# Patient Record
Sex: Female | Born: 1935
Health system: Southern US, Community
[De-identification: ages and names within clinical notes are randomized; demographics above are authoritative.]

## PROBLEM LIST (undated history)

## (undated) DIAGNOSIS — M1990 Unspecified osteoarthritis, unspecified site: Secondary | ICD-10-CM

## (undated) DIAGNOSIS — K219 Gastro-esophageal reflux disease without esophagitis: Secondary | ICD-10-CM

## (undated) DIAGNOSIS — M109 Gout, unspecified: Secondary | ICD-10-CM

## (undated) DIAGNOSIS — R7301 Impaired fasting glucose: Secondary | ICD-10-CM

## (undated) DIAGNOSIS — I10 Essential (primary) hypertension: Secondary | ICD-10-CM

## (undated) DIAGNOSIS — F419 Anxiety disorder, unspecified: Secondary | ICD-10-CM

## (undated) DIAGNOSIS — E119 Type 2 diabetes mellitus without complications: Secondary | ICD-10-CM

## (undated) DIAGNOSIS — E785 Hyperlipidemia, unspecified: Secondary | ICD-10-CM

## (undated) DIAGNOSIS — D649 Anemia, unspecified: Secondary | ICD-10-CM

## (undated) HISTORY — DX: Anxiety disorder, unspecified: F41.9

## (undated) HISTORY — DX: Gastro-esophageal reflux disease without esophagitis: K21.9

## (undated) HISTORY — DX: Hyperlipidemia, unspecified: E78.5

## (undated) HISTORY — DX: Impaired fasting glucose: R73.01

## (undated) HISTORY — DX: Type 2 diabetes mellitus without complications: E11.9

## (undated) HISTORY — PX: ABDOMINAL HYSTERECTOMY: SHX81

## (undated) HISTORY — DX: Gout, unspecified: M10.9

## (undated) HISTORY — DX: Essential (primary) hypertension: I10

## (undated) HISTORY — DX: Anemia, unspecified: D64.9

---

## 2006-07-17 ENCOUNTER — Ambulatory Visit: Payer: Self-pay | Admitting: Cardiology

## 2006-07-24 ENCOUNTER — Ambulatory Visit: Payer: Self-pay | Admitting: Cardiology

## 2006-07-24 ENCOUNTER — Ambulatory Visit: Payer: Self-pay

## 2006-09-20 ENCOUNTER — Ambulatory Visit: Payer: Self-pay | Admitting: Cardiology

## 2006-09-27 ENCOUNTER — Ambulatory Visit: Payer: Self-pay | Admitting: Cardiology

## 2006-11-08 ENCOUNTER — Ambulatory Visit: Payer: Self-pay | Admitting: Cardiology

## 2006-11-30 ENCOUNTER — Ambulatory Visit: Payer: Self-pay

## 2006-12-12 ENCOUNTER — Encounter: Admission: RE | Admit: 2006-12-12 | Discharge: 2006-12-12 | Payer: Self-pay | Admitting: Cardiology

## 2006-12-14 ENCOUNTER — Ambulatory Visit: Payer: Self-pay | Admitting: Cardiology

## 2006-12-15 ENCOUNTER — Encounter: Admission: RE | Admit: 2006-12-15 | Discharge: 2006-12-15 | Payer: Self-pay | Admitting: Cardiology

## 2007-01-26 ENCOUNTER — Ambulatory Visit: Payer: Self-pay | Admitting: Cardiology

## 2007-02-16 ENCOUNTER — Ambulatory Visit: Payer: Self-pay | Admitting: Cardiovascular Disease

## 2007-02-16 LAB — CONVERTED CEMR LAB
CO2: 30 meq/L (ref 19–32)
Calcium: 9.6 mg/dL (ref 8.4–10.5)
Eosinophils Absolute: 0.1 10*3/uL (ref 0.0–0.6)
Eosinophils Relative: 1.7 % (ref 0.0–5.0)
GFR calc Af Amer: 79 mL/min
Glucose, Bld: 105 mg/dL — ABNORMAL HIGH (ref 70–99)
Hemoglobin: 13.8 g/dL (ref 12.0–15.0)
Lymphocytes Relative: 27.4 % (ref 12.0–46.0)
MCV: 81.9 fL (ref 78.0–100.0)
Monocytes Absolute: 0.8 10*3/uL — ABNORMAL HIGH (ref 0.2–0.7)
Neutro Abs: 4.2 10*3/uL (ref 1.4–7.7)
Neutrophils Relative %: 59.3 % (ref 43.0–77.0)
Platelets: 237 10*3/uL (ref 150–400)
Potassium: 4.2 meq/L (ref 3.5–5.1)
Sodium: 139 meq/L (ref 135–145)
WBC: 7 10*3/uL (ref 4.5–10.5)

## 2007-02-21 ENCOUNTER — Ambulatory Visit (HOSPITAL_COMMUNITY): Admission: RE | Admit: 2007-02-21 | Discharge: 2007-02-21 | Payer: Self-pay | Admitting: Cardiovascular Disease

## 2007-04-27 ENCOUNTER — Ambulatory Visit: Payer: Self-pay | Admitting: Cardiology

## 2007-08-23 ENCOUNTER — Ambulatory Visit: Payer: Self-pay | Admitting: Cardiology

## 2008-01-31 ENCOUNTER — Ambulatory Visit: Payer: Self-pay | Admitting: Cardiology

## 2008-01-31 LAB — CONVERTED CEMR LAB
CO2: 27 meq/L (ref 19–32)
Chloride: 105 meq/L (ref 96–112)
Creatinine, Ser: 0.9 mg/dL (ref 0.4–1.2)
Potassium: 4 meq/L (ref 3.5–5.1)
Sodium: 138 meq/L (ref 135–145)

## 2009-02-04 ENCOUNTER — Ambulatory Visit: Payer: Self-pay | Admitting: Cardiology

## 2009-02-04 LAB — CONVERTED CEMR LAB
CO2: 31 meq/L (ref 19–32)
Chloride: 105 meq/L (ref 96–112)
GFR calc Af Amer: 63 mL/min
Glucose, Bld: 143 mg/dL — ABNORMAL HIGH (ref 70–99)
Sodium: 139 meq/L (ref 135–145)

## 2009-05-19 ENCOUNTER — Encounter: Admission: RE | Admit: 2009-05-19 | Discharge: 2009-05-19 | Payer: Self-pay | Admitting: Internal Medicine

## 2010-06-03 ENCOUNTER — Encounter: Admission: RE | Admit: 2010-06-03 | Discharge: 2010-06-03 | Payer: Self-pay | Admitting: Internal Medicine

## 2011-05-10 NOTE — Assessment & Plan Note (Signed)
[facility]                            CARDIOLOGY OFFICE NOTE   NAME:Schiavi, KEAH LAMBA                       MRN:          [id number]  DATE:01/31/2008                            DOB:          1933/02/02    HISTORY:  Ms. Schraeder is a pleasant female who has a history of  hypertension.  Her previous renal arteriogram showed no obstructive  renal artery stenosis.  Since I last saw her she has visited [place] due  to a child that has been diagnosed with multiple sclerosis.  However,  she is feeling well otherwise.  There is no dyspnea, chest pain,  palpitations or syncope.  There is no pedal edema.   MEDICATIONS:  1. Her medications include atenolol 50 mg p.o. daily.  2. Aspirin 81 mg p.o. daily.  3. Norvasc 10 mg p.o. daily.  4. Fish oil.  5. Benicar HCT 40/12.5 daily.   PHYSICAL EXAMINATION:  VITAL SIGNS:  Physical examination today shows a  blood pressure of 183/82, but she has not taken her Benicar HCT.  Her  pulse is 68.  She weighs 189 pounds.  HEENT:  Normal.  NECK:  Supple with no bruits.  CHEST:  Clear.  CARDIOVASCULAR:  Regular rate.  ABDOMEN:  Exam shows no tenderness.  EXTREMITIES:  Show no edema.   Her electrocardiogram shows a sinus rhythm at a rate of 66.  There are  nonspecific ST changes.   DIAGNOSES:  1. Hypertension - Ms. Antuna's blood pressure is elevated today.      However, she has not taken her Benicar HCT.  She has brought      records from home and her systolic is running in the 130-140 range.      We will therefore make no changes.  Note also that her diastolic is      less than 80.  We will check a BMET today to follow potassium and      renal function.  We will see her back in 1 year for this issue.  I      have asked her to find a primary care physician for other medical      issues.  2. Cough with ACE inhibition.     Madolyn Frieze Jens Som, MD, [facility]  Electronically Signed    BSC/MedQ  DD: 01/31/2008  DT:  02/01/2008  Job #: [id number]

## 2011-05-10 NOTE — Assessment & Plan Note (Signed)
[facility]                            CARDIOLOGY OFFICE NOTE   NAME:STINSONCyndee, Giammarco                       MRN:          [id number]  DATE:02/04/2009                            DOB:          October 04, 1933    Ms. Eads is a pleasant female who has a history of hypertension.  A  previous renal arteriogram showed no obstructive renal artery stenosis.  Since I last saw her on January 31, 2008, she is doing well  symptomatically.  She denies any chest pain, palpitations, or shortness  of breath.  She occasionally feels minimally lightheaded when she stands  suddenly.   Her medications at present include:  1. Benicar HCT 40/12.5 mg p.o. daily.  2. Atenolol 50 mg p.o. daily.  3. Aspirin 81 mg p.o. daily.  4. Norvasc 10 mg p.o. daily.  5. Fish oil.   PHYSICAL EXAMINATION:  VITAL SIGNS:  Blood pressure 161/70 and pulse is  68.  She weighs 184 pounds.  HEENT:  Normal.  NECK:  Supple.  CHEST:  Clear.  VASCULAR:  Regular rate and rhythm.  ABDOMEN:  No tenderness.  EXTREMITIES:  No edema.   Electrocardiogram shows a sinus rhythm at a rate of 66.  There is left  ventricle hypertrophy and left axis deviation.  There is poor R-wave  progression.  There are nonspecific ST changes.   DIAGNOSIS:  1. Hypertension - Ms. Riehle's blood pressure appears to be running      slightly high.  She states her systolic runs in the 140s to 145      range at home.  I have asked her to increase her atenolol to 50 mg      in the morning and 25 in the evening.  We will check a BMET today      to follow potassium and renal function.  I have again asked her to      find a primary care physician for other medical issues.  I have      explained that as a cardiologist I do not perform screening tests      such as colonoscopies, mammograms, etc.  We will try to help her      arrange primary care physician.  2. Cough with ACE inhibition.   I will see her back in 1 year.     Madolyn Frieze Jens Som, MD, [facility]  Electronically Signed   BSC/MedQ  DD: 02/04/2009  DT: 02/04/2009  Job #: [id number]

## 2011-05-10 NOTE — Assessment & Plan Note (Signed)
[facility]                                 ON-CALL NOTE   NAME:STINSONChirstina, Maureen [name]                         MRN:          [id number]  DATE:01/27/2008                            DOB:          October 03, 1933    PRIMARY CARDIOLOGIST:  Dr. Olga Millers.   Maureen [name] is a 77 year old female followed by Dr. Jens Som for  hypertension.  She called from [place], phone number [phone],  stating she had lost the bag with all of her medications in it and  requested some help getting her medicines.  I contacted the [facility]  pharmacy, and they stated they would be able to fill all of her  medications by transferring the prescriptions except with for the  Benicar HCT which was out.  I authorized a one-month refill of that  medication and requested that they go ahead and transfer the medications  to [place].  I then called Maureen [name] back and advised her that all she  had to do was call the [facility] pharmacy whose number I gave her and let  them know that she wished to fill those prescriptions in [place] and  then go pick them up.  She seemed a little confused about the process  but stated she would do so and seemed to understand.  She was having no  problems, issues or other concerns.      Theodore Demark, PA-C  Electronically Signed      Jesse Sans. Daleen Squibb, MD, [facility]  Electronically Signed   RB/MedQ  DD: 01/28/2008  DT: 01/29/2008  Job #: [id number]

## 2011-05-10 NOTE — Assessment & Plan Note (Signed)
[facility]                            CARDIOLOGY OFFICE NOTE   NAME:Maureen [name], Maureen [name]                       MRN:          [id number]  DATE:08/23/2007                            DOB:          10-Aug-1933    Maureen [name] is a very pleasant female whom I have seen in the past for  hypertension.  Note, previous renal arteriogram showed no significant  renal artery stenosis.  Since I last saw her, she has some fatigue, but  she denies any dyspnea, chest pain, or syncope.  There is no pedal  edema.  She thinks that the Norvasc may be causing hair loss.   MEDICATIONS:  Include:  1. Benicar/hydrochlorothiazide 40/12.5 mg p.o. daily.  2. Atenolol 50 mg p.o. daily.  3. Aspirin 81 mg p.o. daily.  4. Norvasc 10 mg p.o. daily.   PHYSICAL EXAMINATION:  Shows a blood pressure of 159/80.  Her pulse is  60.  She weighs 177 pounds.  HEENT:  Normal.  NECK:  Supple with no bruits.  CHEST:  Clear.  CARDIOVASCULAR EXAM:  Shows a regular rhythm.  ABDOMINAL EXAM:  Benign.  EXTREMITIES:  Show no edema.  ELECTROCARDIOGRAM:  Shows a sinus rhythm at a rate of 61.  There are no  ST changes noted.   DIAGNOSES:  1. Hypertension.  Patient's blood pressure is mildly elevated today,      but she has tracked it at home, and it typically runs in the      systolic 110 to 120 range.  Her diastolic is in the 70 range.  I,      therefore, will continue with her present medications.  She will      continue to track her blood pressure at home.  We will adjust as      indicated.  2. Cough with ACE inhibition.   We will see her back in approximately 6 months.     Madolyn Frieze Jens Som, MD, [facility]  Electronically Signed    BSC/MedQ  DD: 08/23/2007  DT: 08/23/2007  Job #: [id number]

## 2011-05-13 NOTE — Assessment & Plan Note (Signed)
[facility]                              CARDIOLOGY OFFICE NOTE   NAME:STINSONBaleria, Maureen [name]                       MRN:          [id number]  DATE:09/20/2006                            DOB:          Feb 18, 1933    Mrs. Boulos returns for followup today.  Please refer to my note of  July 17, 2006 for details.  At that time we evaluated her for new diagnosis of  hypertension as well as dyspnea.  We scheduled her to have a nuclear study  which was performed on July 24, 2006.  The ejection fraction was 75% and the  perfusion was normal.  We also scheduled her to have a TSH which was normal.  We asked her to purchase a blood pressure monitor but apparently it was  inaccurate.  We added lisinopril/HCT 20/12.5 mg tablets.  She states that  that medication sometimes makes her feel mildly nauseated.  She also has  developed a cough.  She otherwise denies any dyspnea, chest pain, or pedal  edema.   MEDICATIONS:  Lisinopril/HCT 20/12.5 mg tablets one p.o. daily.   PHYSICAL EXAMINATION:  VITAL SIGNS:  Blood pressure 170/88, pulse 68.  She  weighs 176 pounds.  NECK:  Supple.  CHEST:  Clear.  CARDIOVASCULAR:  Regular rate and rhythm.  EXTREMITIES:  No edema.   DIAGNOSIS:  Hypertension.   PLAN:  Mrs. Koike continues to have an elevated blood pressure despite  lisinopril/HCT.  She has also developed a cough. We will, therefore,  discontinue that medication and I will begin Benicar/HCT 40/12.5 mg p.o.  daily.  We will check BMET in two weeks to follow up potassium and renal  function.  We again reviewed lifestyle modifications including exercise and  she has begun walking.  We discussed the importance of avoiding salt in her  diet and also weight loss.  I have also asked her to purchase a different  type of blood pressure monitor and keep records for [place].  She will see [place]  back in eight weeks and we will adjust her medical regimen as indicated with  the goal  systolic blood pressure of less than 135 and a goal diastolic blood  pressure of less  than 85.  If her blood pressure becomes difficult to control, then we can  consider renal Dopplers to exclude renal artery stenosis although I think  she must likely have essential hypertension.           ______________________________  Madolyn Frieze Jens Som, MD, [facility]   BSC/MedQ  DD:  09/20/2006  DT:  09/21/2006  Job #:  [id number]   cc:   [facility]

## 2011-05-13 NOTE — Assessment & Plan Note (Signed)
[facility]                            CARDIOLOGY OFFICE NOTE   NAME:[name], [name]                       MRN:          [id number]  DATE:04/27/2007                            DOB:          [birth year]    Maureen [name] is a pleasant female who has a history of hypertension.  There was a question of renal artery stenosis on previous renal Doppler  and MRA. However, Dr. Excell Seltzer saw the patient and performed a arteriogram  on February 21, 2007. There was a 25% lesion in the right renal artery  but there was no other significant stenosis identified. Since I last saw  her there is no chest pain, shortness of breath, palpitations, or  syncope. There is no pedal edema. She is not tracking her blood pressure  at home. Her medications include;  1. Benicar/hydrochlorothiazide 40/12.5 mg p.o. daily.  2. Atenolol 50 mg p.o. daily.  3. Aspirin 81 mg p.o. daily.   PHYSICAL EXAMINATION:  Today shows a blood pressure of 178/90 and her  pulse is 68.  NECK: Supple.  CHEST: Clear.  CARDIOVASCULAR EXAM: Reveals a regular rate and rhythm.  EXTREMITIES: Reveal no edema.   DIAGNOSES:  1. Hypertension- the patient's blood pressure remains elevated. Her      renal Doppler suggested renal artery stenosis but the atriogram was      negative. This appears to be essential hypertension. We will      increase her atenolol to 75 mg p.o. daily and I will also add      Norvasc 10 mg p.o. daily. This could be increased further as      needed. I have asked her to buy a blood pressure cuff and she will      track her blood pressure at home and keep records. And I will see      her back in 3 months and we will adjust as indicated.  2. Cough with ACE inhibition.   We will see her back in 3 months.     Madolyn Frieze Jens Som, MD, [facility]  Electronically Signed    BSC/MedQ  DD: 04/27/2007  DT: 04/27/2007  Job #: [id number]

## 2011-05-13 NOTE — Letter (Signed)
February 16, 2007    Madolyn Frieze. Jens Som, MD, [facility]  [address]  [address]  [place]  [place] [address]   RE:  [name], DEWILDE  MRN:  [id number]  /  DOB:  01-19-1933   Dear Maureen [name],   Thank you for allowing me to see Maureen [name] for evaluation of her renal  artery stenosis.  As you know she is a 77 year old women with refractory  hypertension who you have recently evaluated for renal artery stenosis.  In reviewing your notes it looks like she presented initially in July of  2007, with a new diagnosis of hypertension and was found to have  evidence of left ventricular hypertrophy on her electrocardiogram.  She  had never been told of hypertension in the past, but had not seen a  physician in quite some time, so it is difficult to know the timing of  onset for her elevated blood pressure.   It appears that she has been on multiple medications and has continued  to have elevated blood pressure readings at all of her office visits.   Maureen [name] denies any symptoms at this time.  She specifically denies  chest pain, dyspnea, orthopnea, paroxysmal nocturnal dyspnea, light  headiness, syncope, palpitations or other cardiovascular complaints.  She has not had any episodes of hypertensive urgency with associated  symptoms.   Her evaluation to this point has consisted of a renal ultrasound, which  was preformed on December 6 of 2007.  This demonstrated normal size  kidneys with a right kidney length of 10.6 centimeters and a left kidney  length of 11.6 centimeters.  Both kidneys are supplied by a single renal  artery.  The left renal artery velocities are in the normal range.  The  right renal artery velocities were elevated and suggested a stenosis of  less than 60%.  The appearance of the renal artery was suggestive of  fibromuscular dysplasia as the elevated velocities occurred in the mid  portion of the vessel.   She subsequently underwent a renal MRA study, which was preformed on  December 24.  This showed a widely patent left renal artery as well.  The right renal artery had a focal stenosis beginning approximately 1-1  1/2 centimeters beyond the osteum of that vessel.  The stenosis was  estimated in the range of 50%-60% and there was poststenotic dilatation.  Classic findings for FMD of beading was not demonstrated.   PAST MEDICAL HISTORY:  Pertinent for hypertension and a remote  hysterectomy for uterine fibroids.  History is negative for tobacco  abuse, diabetes or dyslipidemia.   MEDICATIONS:  Benicar/HCTZ 40/12.5 mg daily and Atenolol 50 mg daily.   ALLERGIES:  No known drug allergy.   SOCIAL HISTORY:  The patient is widowed.  She has never smoked  cigarettes.  She lives locally in [place].  She has 7 children.   FAMILY HISTORY:  Negative for coronary artery disease.   REVIEW OF SYSTEMS:  A complete 12 point review of systems was performed.  The pertinent positive to report is that of nervousness and anxiety.  Otherwise all symptoms were reviewed and there were no other positive  findings to report.   PHYSICAL EXAMINATION:  The patient is alert and oriented.  She is in no  acute distress.  Weight is 172 pounds, heart rate 77, blood pressure 171/79.  HEENT:  Normal.  NECK:  Normal carotid upstrokes without bruits.  Jugular venous pressure  is normal.  No thyromegaly or  thyroid nodules.  LUNGS:  Clear to auscultation bilaterally.  HEART:  The index is discrete and nondisplaced.  The heart is regular  rate and rhythm without murmur or gallops.  ABDOMEN:  Soft, nontender, no organomegaly.  No abdominal bruits are  present.  BACK:  There is no paraspinal tenderness or flank tenderness.  EXTREMITIES:  No clubbing, cyanosis or edema.  Peripheral pulses are 2+  and equal throughout.  There are no femoral artery bruits.  SKIN:  Warm and dry without rash.  NEUROLOGIC:  Cranial nerves 2 through 12 are intact.  Strength is 5/5  and equal in the arms and  legs bilaterally.   ASSESSMENT:  Maureen [name] is a 77 year old women  with ongoing  hypertension despite 3 drug therapy.  In reviewing her records she has  been on several medications over the past several months.  Her blood  pressure readings have been consistently elevated on all office visits.  Her imaging studies are consistent with at least moderate right renal  artery stenosis.  I think from a standpoint of long term blood pressure  control it would be reasonable to proceed with a diagnostic renal  angiogram to better delineate the severity of her right renal artery  stenosis.  If she has significant stenosis of this vessel then it would  be prudent to treat this with PTA and possible stenting.  If  angiographically this appears to be fibromuscular dysplasia then she may  respond well to PTA alone if needed.  I reviewed this recommendation in  detail with Ms. Pauli who is agreeable to proceed.  She does not like  the idea of long term antihypertensive therapy, but I advised her that  whether we intervene on her renal artery or not she likely will require  at least some ongoing antihypertensive medication over the years.   Maureen [name] thanks again for allowing me to see Maureen [name].  I will be in  contact with you at the time of her renal angiogram.    Sincerely,      Veverly Fells. Excell Seltzer, MD  Electronically Signed    MDC/MedQ  DD: 02/16/2007  DT: 02/16/2007  Job #: [id number]

## 2011-05-13 NOTE — Assessment & Plan Note (Signed)
[facility]                              CARDIOLOGY OFFICE NOTE   NAME:Maureen [name]                       MRN:          [id number]  DATE:11/08/2006                            DOB:          [birth year]    Maureen [name] returns for followup.  She is a pleasant female and has  recently-diagnosed hypertension.  She did develop a cough with ACE  inhibition and we previously placed her on Benicar/HCT 40/12.5 mg p.o.  daily.  We also reviewed lifestyle modifications.  Since I last saw her, she  denies any dyspnea, chest pain, or syncope.  She did not purchase a blood  pressure cuff.   CURRENT MEDICATIONS INCLUDE:  Benicar/HCT 40/12.5 mg tablets one p.o. daily  and fish oil.   PHYSICAL EXAMINATION:  VITAL SIGNS:  Her physical examination today shows a  blood pressure of 160/84 and her pulse is 80.  She weighs 176 pounds.  NECK:  Her neck is supple.  CHEST:  Clear.  CARDIOVASCULAR EXAM:  Regular rate and rhythm.  EXTREMITIES:  Show no edema.   Her electrocardiogram shows a sinus rhythm at a rate of 80.  There is left  ventricular hypertrophy with repolarization abnormalities.   DIAGNOSES:  1. Hypertension.  2. Cough with ACE inhibition.   PLAN:  Maureen [name] blood pressure remains elevated.  We will add Atenolol  50 mg p.o. daily.  We will also schedule her to have renal Dopplers to  screen for renal artery stenosis.  We again reviewed lifestyle  modifications, including diet, exercise and low sodium.  I have asked her to  track her blood pressure at home and we will see her back in three months.     Madolyn Frieze Jens Som, MD, [facility]  Electronically Signed    BSC/MedQ  DD: 11/08/2006  DT: 11/08/2006  Job #: [id number]

## 2011-05-13 NOTE — Assessment & Plan Note (Signed)
[facility]                            CARDIOLOGY OFFICE NOTE   NAME:Maureen [name], [name]                       MRN:          [id number]  DATE:01/26/2007                            DOB:          1933-08-23    Maureen [name] is a pleasant female, who has a history of hypertension.  We did perform a Myoview in July that showed normal LV function and no  ischemia.  She also had renal Dopplers for hypertension that suggested  possible right renal artery stenosis.  She subsequently had an MRA  performed on December 18, 2006.  She was found to have a focal stenosis  of the midportion of the right renal artery in the moderate range of  56%.  There was a suggestion that this was fibromuscular dysplasia.  There was mild pro-stenotic dilatation of the distal right renal artery.  Since I saw her previously, she denies any chest pain or shortness of  breath.  She has not taken the Atenolol that we had prescribed  previously.   MEDICATIONS AT PRESENT INCLUDE:  1. Benicar/HCT 40/12.5 mg p.o. daily.  2. Omega 3 fish oil.   PHYSICAL EXAMINATION:  Physical exam today shows a blood pressure of  160/80 and a pulse of 75.  NECK:  Supple.  CHEST:  Clear.  CARDIOVASCULAR EXAM:  Regular rate and rhythm.  EXTREMITIES:  Show no edema.   ELECTROCARDIOGRAM:  Today shows a sinus rhythm at a rate of 79.  There  is anterior T-wave inversion.   DIAGNOSES:  1. Hypertension.  2. Cough with ACE inhibition.  3. Question fibromuscular dysplasia of the right renal artery.   PLAN:  Maureen [name] continues to have elevated blood pressure.  She has  made lifestyle modifications.  I have asked her to begin Atenolol 50 mg  p.o. daily to see if this improves her blood pressure.  We will schedule  her to have a consult with one of our peripheral vascular cardiologists.  She may need an arteriogram with consideration of stenting of the right  renal artery, if indicated.  We will see her back  in three months.     Madolyn Frieze Jens Som, MD, [facility]  Electronically Signed    BSC/MedQ  DD: 01/26/2007  DT: 01/26/2007  Job #: [id number]

## 2011-05-13 NOTE — Cardiovascular Report (Signed)
Maureen [name], Maureen [name]                ACCOUNT NO.:  [id number]   MEDICAL RECORD NO.:  [id number]          PATIENT TYPE:  AMB   LOCATION:  SDS                          FACILITY:  [facility]   PHYSICIAN:  Veverly Fells. Excell Seltzer, MD  DATE OF BIRTH:  1933-06-15   DATE OF PROCEDURE:  02/21/2007  DATE OF DISCHARGE:                            CARDIAC CATHETERIZATION   PROCEDURE:  Abdominal aortic angiography and bilateral selective renal  angiography.   PROCEDURAL INDICATIONS:  Maureen [name] is a delightful 77 year old woman  who presented for evaluation of right renal artery stenosis.  She has  had refractory hypertension, and on noninvasive imaging, there was a  suggestion of fibromuscular dysplasia on her renal ultrasound.  She  underwent a follow-up renal MRA that was suggestive of moderate right  renal artery stenosis with post-stenotic dilatation.  I evaluated her in  clinic last week and elected to proceed with angiography in the setting  of her refractory hypertension and suggestive of right renal artery  stenosis.   PROCEDURAL DETAILS:  The right groin was prepped and draped under normal  sterile conditions.  A 6-French sheath was inserted using the modified  Seldinger technique.  A 5-French pigtail catheter was placed just above  the renal arteries in the abdominal aorta, and a flush angiogram was  performed of the aorta.  Following that, a 5-French LIMA catheter was  inserted and bilateral selective renal arteriography was performed in  multiple projections.  At the conclusion of the procedure, the patient  was transferred to the holding area in stable condition, and her sheath  will be pulled with manual pressure used for hemostasis.   FINDINGS:  The visualized portions of the abdominal aorta are  angiographically normal without any significant disease.   The right renal artery has a proximal 25% stenosis that is a nonostial  lesion.  Just beyond the stenosis, the vessel bifurcates into  branches.  There is no other obstructive disease seen throughout the right renal  artery.  There is no pressure gradient in the ostium of the right renal  artery.   The left renal artery is widely patent throughout and has no significant  angiographic disease.   ASSESSMENT:  1. Mild right renal artery stenosis with an approximately 25% area of      stenosis in the proximal portion of the vessel.  This is clearly      nonobstructive.  2. Normal left renal artery.  3. Normal visualized portions of the abdominal aorta.   PLAN:  Continue antihypertensive therapy for Maureen [name] who likely has  essential hypertension.      Veverly Fells. Excell Seltzer, MD  Electronically Signed     MDC/MEDQ  D:  02/21/2007  T:  02/21/2007  Job:  [id number]   cc:   Madolyn Frieze. Jens Som, MD, [facility]

## 2011-05-13 NOTE — Assessment & Plan Note (Signed)
[facility]                              CARDIOLOGY OFFICE NOTE   NAME:Maureen [name]                       MRN:          [id number]  DATE:07/17/2006                            DOB:          [birth year]    Maureen [name] is a 77 year old female with recently diagnosed hypertension  whom we were asked to evaluate for her hypertension, dyspnea on exertion and  left ventricular hypertrophy on electrocardiogram.  She has no prior cardiac  history.  Recently she has noticed some dyspnea on exertion but there is no  orthopnea, PND, pedal edema, presyncope, syncope or exertional chest pain.  She occasionally feels her heart skip but there are no other symptoms  noted.  Her medications include herbal V after meal.  She was also provided  a prescription for Benicar HCTZ 20/12/5 mg tablets but she has not initiated  this yet.  SHE HAS NO KNOWN DRUG ALLERGIES.   SOCIAL HISTORY:  She does not smoke nor does she consume alcohol.   FAMILY HISTORY:  Negative for coronary artery disease.   PAST MEDICAL HISTORY:  Significant for recently diagnosed hypertension but  there is no diabetes mellitus or hyperlipidemia by her report.  She has no  other medical  history noted.  She did have a recent UTI.  She has had  previous hysterectomy secondary to fibroids.   REVIEW OF SYSTEMS:  She denies any weight change.  No headaches, fevers or  chills.  There is no productive cough or hemoptysis.  There is no dysphagia,  odynophagia, melena, or hematochezia.  There is no dysuria or hematuria.  No  rash or seizure activity.  There is no orthopnea, PND, pedal edema.  Remainder of systems are negative.   PHYSICAL EXAMINATION:  VITAL SIGNS:  Blood pressure 216/100 in the right arm  and 221/90 in the left arm.  Pulse is 75.  GENERAL:  She is well-developed, well-nourished, in no acute distress.  SKIN:  Warm and dry.  She does not appear depressed.  No history of  __________.  HEENT:  Unremarkable.  Normal eye lids.  NECK:  Supple with a normal upstroke bilaterally.  There are no bruits and  no __________.  No jugular venous distention.  I could not appreciate  thyromegaly.  CHEST:  Clear to auscultation, normal expansion.  CARDIOVASCULAR:  Regular rate and rhythm with normal S1 and S2.  There are  no murmurs, rubs, or gallops noted.  ABDOMEN:  Nontender, nondistended, positive bowel sounds.  No  hepatosplenomegaly, no mass appreciated.  There was no abdominal bruit.  EXTREMITIES:  She has 2+ femoral pulses bilaterally, no bruits.  Extremities  show no edema I could palpate, no cords.  She has 2+ dorsalis pedis pulses  bilaterally.  NEUROLOGIC EXAM:  Grossly intact.   LABORATORY:  Electrocardiogram shows normal sinus rhythm.  There is evidence  of left ventricular hypertrophy.  There is lateral T wave inversion.   DIAGNOSIS:  New diagnosis of hypertension.   PLAN:  [name] presents  for evaluation of new hypertension, left  ventricular hypertrophy on electrocardiogram  and dyspnea on exertion.  We  will plan to risk stratify with a stress Myoview.  If it shows normal  perfusion and normal LV function, then we will not pursue further  ischemic  evaluation.  We will begin Lisinopril HCT 20/12.5 mg tablets one p.o. daily.  We will check a BMET in one week to follow potassium and renal function.  We  will check a TSH at that time.  I have asked her to purchase a blood  pressure monitor and to record her blood pressures at home.  We will make  further adjustments based on those results.  We also discussed the  importance of exercise and also a low salt diet.  Note, she does not consume  alcohol.  We will see her back in 4-6 weeks for further adjustment in her  medical regimen.  If she becomes very difficult to control, then we could  consider evaluation for other causes of hypertension including renal  Dopplers to exclude renal artery stenosis.                               Madolyn Frieze Jens Som, MD, [facility]    BSC/MedQ  DD:  07/17/2006  DT:  07/17/2006  Job #:  [id number]   cc:   [facility]

## 2011-06-13 ENCOUNTER — Other Ambulatory Visit: Payer: Self-pay | Admitting: Internal Medicine

## 2011-06-13 DIAGNOSIS — Z1231 Encounter for screening mammogram for malignant neoplasm of breast: Secondary | ICD-10-CM

## 2011-06-24 ENCOUNTER — Ambulatory Visit
Admission: RE | Admit: 2011-06-24 | Discharge: 2011-06-24 | Disposition: A | Discharge status: Home / Self Care | Payer: Medicare Other | Source: Ambulatory Visit | Attending: Internal Medicine | Admitting: Internal Medicine

## 2011-06-24 DIAGNOSIS — Z1231 Encounter for screening mammogram for malignant neoplasm of breast: Secondary | ICD-10-CM

## 2012-05-26 LAB — HM DEXA SCAN

## 2012-07-17 ENCOUNTER — Other Ambulatory Visit: Payer: Self-pay | Admitting: Internal Medicine

## 2012-07-17 DIAGNOSIS — Z1231 Encounter for screening mammogram for malignant neoplasm of breast: Secondary | ICD-10-CM

## 2012-07-27 ENCOUNTER — Ambulatory Visit: Payer: Medicare Other

## 2012-07-31 ENCOUNTER — Ambulatory Visit
Admission: RE | Admit: 2012-07-31 | Discharge: 2012-07-31 | Disposition: A | Discharge status: Home / Self Care | Payer: Medicare Other | Source: Ambulatory Visit | Attending: Internal Medicine | Admitting: Internal Medicine

## 2012-07-31 DIAGNOSIS — Z1231 Encounter for screening mammogram for malignant neoplasm of breast: Secondary | ICD-10-CM

## 2013-04-11 ENCOUNTER — Encounter: Payer: Self-pay | Admitting: *Deleted

## 2013-04-11 DIAGNOSIS — E785 Hyperlipidemia, unspecified: Secondary | ICD-10-CM | POA: Insufficient documentation

## 2013-04-11 DIAGNOSIS — I10 Essential (primary) hypertension: Secondary | ICD-10-CM

## 2013-04-11 DIAGNOSIS — M109 Gout, unspecified: Secondary | ICD-10-CM

## 2013-04-22 ENCOUNTER — Ambulatory Visit (INDEPENDENT_AMBULATORY_CARE_PROVIDER_SITE_OTHER): Payer: Medicare Other | Admitting: Family Medicine

## 2013-04-22 ENCOUNTER — Encounter: Payer: Self-pay | Admitting: Family Medicine

## 2013-04-22 VITALS — BP 127/66 | HR 71 | Wt 174.0 lb

## 2013-04-22 DIAGNOSIS — E785 Hyperlipidemia, unspecified: Secondary | ICD-10-CM

## 2013-04-22 DIAGNOSIS — E559 Vitamin D deficiency, unspecified: Secondary | ICD-10-CM

## 2013-04-22 DIAGNOSIS — I10 Essential (primary) hypertension: Secondary | ICD-10-CM

## 2013-04-22 DIAGNOSIS — M109 Gout, unspecified: Secondary | ICD-10-CM

## 2013-04-22 DIAGNOSIS — R5381 Other malaise: Secondary | ICD-10-CM

## 2013-04-22 DIAGNOSIS — Z5181 Encounter for therapeutic drug level monitoring: Secondary | ICD-10-CM

## 2013-04-22 DIAGNOSIS — R7301 Impaired fasting glucose: Secondary | ICD-10-CM

## 2013-04-22 MED ORDER — INDOMETHACIN ER 75 MG PO CPCR: 75.0000 mg | ORAL_CAPSULE | Freq: Two times a day (BID) | ORAL | Status: DC

## 2013-04-22 MED ORDER — DICLOFENAC SODIUM 1 % TD GEL: 4.0000 g | Freq: Four times a day (QID) | TRANSDERMAL | Status: DC

## 2013-04-22 MED ORDER — COLCHICINE 0.6 MG PO TABS: 0.6000 mg | ORAL_TABLET | Freq: Two times a day (BID) | ORAL | Status: AC

## 2013-04-22 MED ORDER — ALLOPURINOL 300 MG PO TABS: 300.0000 mg | ORAL_TABLET | Freq: Every day | ORAL | Status: DC

## 2013-04-22 MED ORDER — ATENOLOL 50 MG PO TABS: 50.0000 mg | ORAL_TABLET | Freq: Every day | ORAL | Status: DC

## 2013-04-22 NOTE — Patient Instructions (Addendum)
1)  Gout -   Take the Colcrys (colchicine .6 mg) twice a day for 2 weeks.  Then add 1/2 of the Allopurinol (150 mg) daily to the colchicine for 2 weeks.  Then increase the Allopurinol to 1 tab (300 mg) and stay on the colchicine for another 2 weeks then you can stop the colchicine and stay on the Allopurinol 300 mg daily.  You will take the colchicine in the future for increased pain (gout attack).  We will recheck your blood level in 6 weeks.    2)  Right Shoulder Pain -   Apply 4 grams of the Voltaren Gel up to 4 times per day for a couple of weeks.  If you don't get better you can go see Dr. Pearletha Forge for an injection.     Shoulder Pain The shoulder is the joint that connects your arms to your body. The bones that form the shoulder joint include the upper arm bone (humerus), the shoulder blade (scapula), and the collarbone (clavicle). The top of the humerus is shaped like a ball and fits into a rather flat socket on the scapula (glenoid cavity). A combination of muscles and strong, fibrous tissues that connect muscles to bones (tendons) support your shoulder joint and hold the ball in the socket. Small, fluid-filled sacs (bursae) are located in different areas of the joint. They act as cushions between the bones and the overlying soft tissues and help reduce friction between the gliding tendons and the bone as you move your arm. Your shoulder joint allows a wide range of motion in your arm. This range of motion allows you to do things like scratch your back or throw a ball. However, this range of motion also makes your shoulder more prone to pain from overuse and injury. Causes of shoulder pain can originate from both injury and overuse and usually can be grouped in the following four categories:  Redness, swelling, and pain (inflammation) of the tendon (tendinitis) or the bursae (bursitis).  Instability, such as a dislocation of the joint.  Inflammation of the joint (arthritis).  Broken bone  (fracture). HOME CARE INSTRUCTIONS   Apply ice to the sore area.  Put ice in a plastic bag.  Place a towel between your skin and the bag.  Leave the ice on for 15 to 20 minutes, 3 to 4 times per day for the first 2 days.  If you have a shoulder sling or immobilizer, wear it as long as your caregiver instructs. Only remove it to shower or bathe. Move your arm as little as possible, but keep your hand moving to prevent swelling.  Only take over-the-counter or prescription medicines for pain, discomfort, or fever as directed by your caregiver. SEEK MEDICAL CARE IF:   Your shoulder pain increases, or new pain develops in your arm, hand, or fingers.  Your hand or fingers become cold and numb.  Your pain is not relieved with medicines. SEEK IMMEDIATE MEDICAL CARE IF:   Your arm, hand, or fingers are numb or tingling.  Your arm, hand, or fingers are significantly swollen or turn white or blue. MAKE SURE YOU:   Understand these instructions.  Will watch your condition.  Will get help right away if you are not doing well or get worse. Document Released: 09/21/2005 Document Revised: 03/05/2012 Document Reviewed: 11/26/2011 [facility] Patient Information 2013 [place], [place]. Shoulder Range of Motion Exercises The shoulder is the most flexible joint in the human body. Because of this it is also the  most unstable joint in the body. All ages can develop shoulder problems. Early treatment of problems is necessary for a good outcome. People react to shoulder pain by decreasing the movement of the joint. After a brief period of time, the shoulder can become "frozen". This is an almost complete loss of the ability to move the damaged shoulder. Following injuries your caregivers can give you instructions on exercises to keep your range of motion (ability to move your shoulder freely), or regain it if it has been lost.  EXERCISES EXERCISES TO MAINTAIN THE MOBILITY OF YOUR SHOULDER: Codman's  Exercise or Pendulum Exercise  This exercise may be performed in a prone (face-down) lying position or standing while leaning on a chair with the opposite arm. Its purpose is to relax the muscles in your shoulder and slowly but surely increase the range of motion and to relieve pain.  Lie on your stomach close to the side edge of the bed. Let your weak arm hang over the edge of the bed. Relax your shoulder, arm and hand. Let your shoulder blade relax and drop down.  Slowly and gently swing your arm forward and back. Do not use your neck muscles; relax them. It might be easier to have someone else gently start swinging your arm.  As pain decreases, increase your swing. To start, arm swing should begin at 15 degree angles. In time and as pain lessens, move to 30-45 degree angles. Start with swinging for about 15 seconds, and work towards swinging for 3 to 5 minutes.  This exercise may also be performed in a standing/bent over position.  Stand and hold onto a sturdy chair with your good arm. Bend forward at the waist and bend your knees slightly to help protect your back. Relax your weak arm, let it hang limp. Relax your shoulder blade and let it drop.  Keep your shoulder relaxed and use body motion to swing your arm in small circles.  Stand up tall and relax.  Repeat motion and change direction of circles.  Start with swinging for about 30 seconds, and work towards swinging for 3 to 5 minutes. STRETCHING EXERCISES:  Lift your arm out in front of you with the elbow bent at 90 degrees. Using your other arm gently pull the elbow forward and across your body.  Bend one arm behind you with the palm facing outward. Using the other arm, hold a towel or rope and reach this arm up above your head, then bend it at the elbow to move your wrist to behind your neck. Grab the free end of the towel with the hand behind your back. Gently pull the towel up with the hand behind your neck, gradually increasing  the pull on the hand behind the small of your back. Then, gradually pull down with the hand behind the small of your back. This will pull the hand and arm behind your neck further. Both shoulders will have an increased range of motion with repetition of this exercise. STRENGTHENING EXERCISES:  Standing with your arm at your side and straight out from your shoulder with the elbow bent at 90 degrees, hold onto a small weight and slowly raise your hand so it points straight up in the air. Repeat this five times to begin with, and gradually increase to ten times. Do this four times per day. As you grow stronger you can gradually increase the weight.  Repeat the above exercise, only this time using an elastic band. Start with your  hand up in the air and pull down until your hand is by your side. As you grow stronger, gradually increase the amount you pull by increasing the number or size of the elastic bands. Use the same amount of repetitions.  Standing with your hand at your side and holding onto a weight, gradually lift the hand in front of you until it is over your head. Do the same also with the hand remaining at your side and lift the hand away from your body until it is again over your head. Repeat this five times to begin with, and gradually increase to ten times. Do this four times per day. As you grow stronger you can gradually increase the weight. Document Released: 09/10/2003 Document Revised: 03/05/2012 Document Reviewed: 12/12/2005 [facility] Patient Information 2013 [place], [place].

## 2013-04-22 NOTE — Progress Notes (Signed)
  Subjective:    Patient ID: Maureen [name], female    DOB: 04-27-1933, 79 y.o.   MRN: [id number]  HPI Maureen [name] is here today to discuss a few issues.  1) Gout:  She has been taking one Indomethacin at night since her last visit. She says that her knee pain is better.  She misunderstood what she was supposed to do with the Colcrys and Allopurinol and never got either of them filled.     2)  Right Shoulder Pain:  She has been having pain and stiffness in her right shoulder for a couple of months.  She was visiting her daughter a few weeks ago and feels that her pain worsend after she carried her heavy bags.  She has taken some Advil for her pain with no relief.    Review of Systems  Constitutional: Negative.   HENT: Negative.   Eyes: Negative.   Respiratory: Negative.   Cardiovascular: Negative.   Gastrointestinal: Negative.   Genitourinary: Negative.   Musculoskeletal: Positive for arthralgias.       Right Shoulder  Skin: Negative.        Objective:   Physical Exam  Constitutional: She appears well-nourished. No distress.  HENT:  Head: Normocephalic.  Eyes: No scleral icterus.  Neck: No thyromegaly present.  Cardiovascular: Normal rate, regular rhythm and normal heart sounds.   Pulmonary/Chest: Effort normal and breath sounds normal.  Abdominal: There is no tenderness.  Musculoskeletal: She exhibits tenderness (Right Shoulder). She exhibits no edema.  Neurological: She is alert.  Skin: Skin is warm and dry.  Psychiatric: She has a normal mood and affect. Her behavior is normal. Judgment and thought content normal.       Assessment & Plan:   1)  Gout - We went over again how she is to take the Colcrys and Allopurinol.   2)  Right Shoulder Pain - She is to apply Voltaren Gel.   3)  Hypertension - Refilled her atenolol.

## 2013-04-28 DIAGNOSIS — I10 Essential (primary) hypertension: Secondary | ICD-10-CM | POA: Insufficient documentation

## 2013-04-28 DIAGNOSIS — R7301 Impaired fasting glucose: Secondary | ICD-10-CM | POA: Insufficient documentation

## 2013-04-28 DIAGNOSIS — E559 Vitamin D deficiency, unspecified: Secondary | ICD-10-CM | POA: Insufficient documentation

## 2013-04-28 DIAGNOSIS — Z5181 Encounter for therapeutic drug level monitoring: Secondary | ICD-10-CM | POA: Insufficient documentation

## 2013-04-28 DIAGNOSIS — R5381 Other malaise: Secondary | ICD-10-CM | POA: Insufficient documentation

## 2013-05-24 ENCOUNTER — Encounter: Payer: Self-pay | Admitting: *Deleted

## 2013-05-27 ENCOUNTER — Other Ambulatory Visit: Payer: Medicare Other

## 2013-05-27 DIAGNOSIS — R7301 Impaired fasting glucose: Secondary | ICD-10-CM

## 2013-05-27 DIAGNOSIS — R5381 Other malaise: Secondary | ICD-10-CM

## 2013-05-27 DIAGNOSIS — M109 Gout, unspecified: Secondary | ICD-10-CM

## 2013-05-27 DIAGNOSIS — Z5181 Encounter for therapeutic drug level monitoring: Secondary | ICD-10-CM

## 2013-05-27 DIAGNOSIS — E785 Hyperlipidemia, unspecified: Secondary | ICD-10-CM

## 2013-05-27 LAB — COMPREHENSIVE METABOLIC PANEL WITH GFR
ALT: 39 U/L — ABNORMAL HIGH (ref 0–35)
AST: 26 U/L (ref 0–37)
Albumin: 4.1 g/dL (ref 3.5–5.2)
Alkaline Phosphatase: 58 U/L (ref 39–117)
BUN: 16 mg/dL (ref 6–23)
CO2: 30 meq/L (ref 19–32)
Calcium: 9.7 mg/dL (ref 8.4–10.5)
Chloride: 103 meq/L (ref 96–112)
Creat: 1.03 mg/dL (ref 0.50–1.10)
Glucose, Bld: 103 mg/dL — ABNORMAL HIGH (ref 70–99)
Potassium: 4.3 meq/L (ref 3.5–5.3)
Sodium: 139 meq/L (ref 135–145)
Total Bilirubin: 0.5 mg/dL (ref 0.3–1.2)
Total Protein: 7.6 g/dL (ref 6.0–8.3)

## 2013-05-27 LAB — URIC ACID: Uric Acid, Serum: 4.9 mg/dL (ref 2.4–7.0)

## 2013-05-27 LAB — CBC
HCT: 41.4 % (ref 36.0–46.0)
Hemoglobin: 14.1 g/dL (ref 12.0–15.0)
MCH: 26.9 pg (ref 26.0–34.0)
MCHC: 34.1 g/dL (ref 30.0–36.0)
MCV: 79 fL (ref 78.0–100.0)
Platelets: 231 10*3/uL (ref 150–400)
RBC: 5.24 MIL/uL — ABNORMAL HIGH (ref 3.87–5.11)
RDW: 16 % — ABNORMAL HIGH (ref 11.5–15.5)
WBC: 7.5 10*3/uL (ref 4.0–10.5)

## 2013-05-27 LAB — LIPID PANEL
Cholesterol: 149 mg/dL (ref 0–200)
HDL: 68 mg/dL
LDL Cholesterol: 69 mg/dL (ref 0–99)
Total CHOL/HDL Ratio: 2.2 ratio
Triglycerides: 60 mg/dL
VLDL: 12 mg/dL (ref 0–40)

## 2013-05-27 LAB — TSH: TSH: 2.662 u[IU]/mL (ref 0.350–4.500)

## 2013-05-27 LAB — HEMOGLOBIN A1C
Hgb A1c MFr Bld: 6.1 % — ABNORMAL HIGH
Mean Plasma Glucose: 128 mg/dL — ABNORMAL HIGH

## 2013-06-03 ENCOUNTER — Encounter: Payer: Self-pay | Admitting: Family Medicine

## 2013-06-03 ENCOUNTER — Ambulatory Visit (INDEPENDENT_AMBULATORY_CARE_PROVIDER_SITE_OTHER): Payer: Medicare Other | Admitting: Family Medicine

## 2013-06-03 VITALS — BP 139/66 | HR 61 | Wt 173.0 lb

## 2013-06-03 DIAGNOSIS — E785 Hyperlipidemia, unspecified: Secondary | ICD-10-CM

## 2013-06-03 DIAGNOSIS — I10 Essential (primary) hypertension: Secondary | ICD-10-CM

## 2013-06-03 DIAGNOSIS — M1990 Unspecified osteoarthritis, unspecified site: Secondary | ICD-10-CM | POA: Insufficient documentation

## 2013-06-03 DIAGNOSIS — R7301 Impaired fasting glucose: Secondary | ICD-10-CM

## 2013-06-03 DIAGNOSIS — M109 Gout, unspecified: Secondary | ICD-10-CM

## 2013-06-03 DIAGNOSIS — E559 Vitamin D deficiency, unspecified: Secondary | ICD-10-CM

## 2013-06-03 MED ORDER — CANAGLIFLOZIN 300 MG PO TABS: 1.0000 | ORAL_TABLET | Freq: Every day | ORAL | Status: DC

## 2013-06-03 MED ORDER — ALLOPURINOL 300 MG PO TABS: 300.0000 mg | ORAL_TABLET | Freq: Every day | ORAL | Status: AC

## 2013-06-03 NOTE — Patient Instructions (Addendum)
1)  BP - Your BP was normal at today's visit.  Stay on the Benicar 40/12.5, atenolol 50 mg and 1/2 of the amlodipine 10 mg.    2)  Cholesterol - Perfect on 20 mg of atorvastatin so stay on it.    3)  Gout - Your uric acid level is now perfect so stay on the allopurinol 300 mg  4)  Blood Sugar - Stop the Glipizide and start on Invokana 100 mg.  I have ordered 300 mg from [facility].  You will take one daily.  This may help with some weight loss which will help your knee pain.    5)  Osteoarthritis - You can add some Tylenol 1000 mg up to 3 times per day if needed for pain.    Diabetes and Exercise Regular exercise is important and can help:   Control blood glucose (sugar).  Decrease blood pressure.    Control blood lipids (cholesterol, triglycerides).  Improve overall health. BENEFITS FROM EXERCISE  Improved fitness.  Improved flexibility.  Improved endurance.  Increased bone density.  Weight control.  Increased muscle strength.  Decreased body fat.  Improvement of the body's use of insulin, a hormone.  Increased insulin sensitivity.  Reduction of insulin needs.  Reduced stress and tension.  Helps you feel better. People with diabetes who add exercise to their lifestyle gain additional benefits, including:  Weight loss.  Reduced appetite.  Improvement of the body's use of blood glucose.  Decreased risk factors for heart disease:  Lowering of cholesterol and triglycerides.  Raising the level of good cholesterol (high-density lipoproteins, HDL).  Lowering blood sugar.  Decreased blood pressure. TYPE 1 DIABETES AND EXERCISE  Exercise will usually lower your blood glucose.  If blood glucose is greater than 240 mg/dl, check urine ketones. If ketones are present, do not exercise.  Location of the insulin injection sites may need to be adjusted with exercise. Avoid injecting insulin into areas of the body that will be exercised. For example, avoid  injecting insulin into:  The arms when playing tennis.  The legs when jogging. For more information, discuss this with your caregiver.  Keep a record of:  Food intake.  Type and amount of exercise.  Expected peak times of insulin action.  Blood glucose levels. Do this before, during, and after exercise. Review your records with your caregiver. This will help you to develop guidelines for adjusting food intake and insulin amounts.  TYPE 2 DIABETES AND EXERCISE  Regular physical activity can help control blood glucose.  Exercise is important because it may:  Increase the body's sensitivity to insulin.  Improve blood glucose control.  Exercise reduces the risk of heart disease. It decreases serum cholesterol and triglycerides. It also lowers blood pressure.  Those who take insulin or oral hypoglycemic agents should watch for signs of hypoglycemia. These signs include dizziness, shaking, sweating, chills, and confusion.  Body water is lost during exercise. It must be replaced. This will help to avoid loss of body fluids (dehydration) or heat stroke. Be sure to talk to your caregiver before starting an exercise program to make sure it is safe for you. Remember, any activity is better than none.  Document Released: 03/03/2004 Document Revised: 03/05/2012 Document Reviewed: 06/18/2009 [facility] Patient Information 2014 [place], [place].

## 2013-06-03 NOTE — Assessment & Plan Note (Signed)
She is doing fine on D3 2,000 IU daily.

## 2013-06-03 NOTE — Progress Notes (Signed)
  Subjective:    Patient ID: Maureen [name], female    DOB: [birth year], 79 y.o.   MRN: [id number]  HPI  Maureen [name] is here today to go over her most recent lab results and to discuss the conditions listed below.  1)  Hypertension:  She is taking 5 mg of amlodipine, atenolol 50 mg and Benicar 40/12.5 for his pressure.    2)  Gout:  Her generalized pain has improved since going on the allopurinol.  She has Colcrys and Indomethacin for acute attacks.   3) Hyperlipidemia:  She has done well on Lipitor.    4) Impaired Fasting Sugar:  She has done well on glipizide.      Review of Systems  Musculoskeletal: Positive for back pain and arthralgias. Negative for joint swelling.   Past Medical History  Diagnosis Date  . Hyperlipidemia   . Hypertension   . IFG (impaired fasting glucose)   . Anemia    Family History  Problem Relation Age of Onset  . Stroke Mother    History   Social History Narrative   Marital Status: Widowed    Children:  Sons Jillyn Hidden, Casimiro Needle and [place])  Daughter Elinor Dodge, [place], Okey Regal and [place])    Pets: None    Living Situation: Lives alone     Occupation:  Retired    Education: 11th Grade    Tobacco Use/Exposure:  None    Alcohol Use:  Occasional   Drug Use:  None   Diet:  Regular   Exercise:  None   Hobbies:  Crafting and sewing.                 Objective:   Physical Exam  Constitutional: She appears well-nourished. No distress.  HENT:  Head: Normocephalic.  Eyes: No scleral icterus.  Neck: No thyromegaly present.  Cardiovascular: Normal rate, regular rhythm and normal heart sounds.   Pulmonary/Chest: Effort normal and breath sounds normal.  Abdominal: There is no tenderness.  Musculoskeletal: She exhibits no edema and no tenderness.  Neurological: She is alert.  Skin: Skin is warm and dry.  Psychiatric: She has a normal mood and affect. Her behavior is normal. Judgment and thought content normal.          Assessment & Plan:

## 2013-06-03 NOTE — Assessment & Plan Note (Signed)
Her A1c is okay at 6.1%.  She has noticed since she started on the Glipizide that she can't lose weight.  We'll change her to Invokana to see if we can help get her A1c to normal and also help her with some weight loss which will help her complaints of pain.

## 2013-06-03 NOTE — Assessment & Plan Note (Signed)
She has pain in her knees, hips, back and right shoulder.  Her pain is better than it was when she first came to our office.  She takes Indomethacin 75 mg at night and Tylenol occasionally.  She does take 2 OTC fish oils daily.

## 2013-06-03 NOTE — Assessment & Plan Note (Signed)
Her uric acid level is down from 8.0 to 4.9 on 300 mg of allopurinol.  She will remain on this dosage.

## 2013-06-03 NOTE — Assessment & Plan Note (Signed)
Her BP is controlled on 5 mg of amlodipine, 50 mg of atenolol and Benicar HCT 40/12.Marland Kitchen5

## 2013-06-03 NOTE — Assessment & Plan Note (Signed)
Lipid panel is perfect on 20 mg of atorvastatin so she will remain on this dosage.

## 2013-08-29 ENCOUNTER — Ambulatory Visit (INDEPENDENT_AMBULATORY_CARE_PROVIDER_SITE_OTHER): Payer: Medicare Other | Admitting: Family Medicine

## 2013-08-29 ENCOUNTER — Encounter: Payer: Self-pay | Admitting: Family Medicine

## 2013-08-29 VITALS — BP 146/75 | HR 59

## 2013-08-29 DIAGNOSIS — R61 Generalized hyperhidrosis: Secondary | ICD-10-CM

## 2013-08-29 DIAGNOSIS — R0789 Other chest pain: Secondary | ICD-10-CM

## 2013-08-29 DIAGNOSIS — F411 Generalized anxiety disorder: Secondary | ICD-10-CM

## 2013-08-29 LAB — GLUCOSE, POCT (MANUAL RESULT ENTRY): POC Glucose: 122 mg/dl — AB (ref 70–99)

## 2013-08-29 MED ORDER — ESCITALOPRAM OXALATE 5 MG PO TABS: 5.0000 mg | ORAL_TABLET | Freq: Every day | ORAL | Status: DC

## 2013-08-29 MED ORDER — HYDROXYZINE PAMOATE 25 MG PO CAPS: ORAL_CAPSULE | ORAL | Status: DC

## 2013-08-29 NOTE — Progress Notes (Signed)
  Subjective:    Patient ID: Maureen [name], female    DOB: July 08, 1933, 79 y.o.   MRN: [id number]  HPI  Maureen [name] is here today because she says that she feels that she is "losing control of her body".  She started having some shaking of her body which began on Sunday 08/25/13.  She took her blood pressure and it was 180/79.  She feels very lightheaded.  She has been eating and sleeping well.  She has also taken all her medications as prescribed.    Review of Systems  Constitutional: Positive for diaphoresis.  Respiratory: Positive for chest tightness and shortness of breath.   Neurological: Positive for tremors.  Psychiatric/Behavioral: Positive for agitation. The patient is nervous/anxious.     Past Medical History  Diagnosis Date  . Hyperlipidemia   . Hypertension   . IFG (impaired fasting glucose)   . Anemia     Past Surgical History  Procedure Laterality Date  . Abdominal hysterectomy      Family History  Problem Relation Age of Onset  . Stroke Mother     History   Social History Narrative   Marital Status: Widowed    Children:  Sons Jillyn Hidden, Casimiro Needle and [place])  Daughter Elinor Dodge, [place], Okey Regal and [place])    Pets: None    Living Situation: Lives alone     Occupation:  Retired    Education: 11th Grade    Tobacco Use/Exposure:  None    Alcohol Use:  Occasional   Drug Use:  None   Diet:  Regular   Exercise:  None   Hobbies:  Crafting and sewing.                Objective:   Physical Exam  Vitals reviewed. Constitutional: She is oriented to person, place, and time. She appears well-developed and well-nourished. No distress.  Neck: No thyromegaly present.  Cardiovascular: Normal rate and regular rhythm.   Pulmonary/Chest: Effort normal and breath sounds normal.  Musculoskeletal: She exhibits no edema and no tenderness.  Neurological: She is alert and oriented to person, place, and time.  Skin: Skin is warm and dry.  Psychiatric:  She appears to be anxious.             Assessment & Plan:

## 2013-09-23 ENCOUNTER — Other Ambulatory Visit: Payer: Self-pay

## 2013-09-23 DIAGNOSIS — Z1231 Encounter for screening mammogram for malignant neoplasm of breast: Secondary | ICD-10-CM

## 2013-09-30 ENCOUNTER — Encounter: Payer: Self-pay | Admitting: Family Medicine

## 2013-10-24 ENCOUNTER — Ambulatory Visit
Admission: RE | Admit: 2013-10-24 | Discharge: 2013-10-24 | Disposition: A | Discharge status: Home / Self Care | Payer: Medicare Other | Source: Ambulatory Visit

## 2013-10-24 DIAGNOSIS — Z1231 Encounter for screening mammogram for malignant neoplasm of breast: Secondary | ICD-10-CM

## 2013-10-29 ENCOUNTER — Other Ambulatory Visit: Payer: Self-pay | Admitting: Family Medicine

## 2013-10-29 DIAGNOSIS — R928 Other abnormal and inconclusive findings on diagnostic imaging of breast: Secondary | ICD-10-CM

## 2013-11-02 DIAGNOSIS — F411 Generalized anxiety disorder: Secondary | ICD-10-CM | POA: Insufficient documentation

## 2013-11-02 DIAGNOSIS — R0789 Other chest pain: Secondary | ICD-10-CM | POA: Insufficient documentation

## 2013-11-02 DIAGNOSIS — R61 Generalized hyperhidrosis: Secondary | ICD-10-CM | POA: Insufficient documentation

## 2013-11-02 NOTE — Assessment & Plan Note (Signed)
Checked her blood sugar which was WNL.

## 2013-11-02 NOTE — Assessment & Plan Note (Signed)
She is to start on Lexapro and take the Vistaril as needed for increased anxiety.

## 2013-11-02 NOTE — Assessment & Plan Note (Signed)
An EKG was checked which was WNL.

## 2013-11-14 ENCOUNTER — Ambulatory Visit
Admission: RE | Admit: 2013-11-14 | Discharge: 2013-11-14 | Disposition: A | Discharge status: Home / Self Care | Payer: Medicare Other | Source: Ambulatory Visit | Attending: Family Medicine | Admitting: Family Medicine

## 2013-11-14 DIAGNOSIS — R928 Other abnormal and inconclusive findings on diagnostic imaging of breast: Secondary | ICD-10-CM

## 2013-11-15 ENCOUNTER — Other Ambulatory Visit: Payer: Medicare Other

## 2013-12-03 ENCOUNTER — Ambulatory Visit (INDEPENDENT_AMBULATORY_CARE_PROVIDER_SITE_OTHER): Payer: Medicare Other | Admitting: Family Medicine

## 2013-12-03 ENCOUNTER — Encounter: Payer: Self-pay | Admitting: *Deleted

## 2013-12-03 ENCOUNTER — Encounter: Payer: Self-pay | Admitting: Family Medicine

## 2013-12-03 ENCOUNTER — Ambulatory Visit (HOSPITAL_BASED_OUTPATIENT_CLINIC_OR_DEPARTMENT_OTHER)
Admission: RE | Admit: 2013-12-03 | Discharge: 2013-12-03 | Disposition: A | Discharge status: Home / Self Care | Payer: Medicare Other | Source: Ambulatory Visit | Attending: Family Medicine | Admitting: Family Medicine

## 2013-12-03 ENCOUNTER — Encounter (INDEPENDENT_AMBULATORY_CARE_PROVIDER_SITE_OTHER): Payer: Self-pay

## 2013-12-03 VITALS — BP 160/76 | HR 65 | Resp 16 | Wt 169.0 lb

## 2013-12-03 DIAGNOSIS — M1710 Unilateral primary osteoarthritis, unspecified knee: Secondary | ICD-10-CM | POA: Insufficient documentation

## 2013-12-03 DIAGNOSIS — R7301 Impaired fasting glucose: Secondary | ICD-10-CM

## 2013-12-03 DIAGNOSIS — M25569 Pain in unspecified knee: Secondary | ICD-10-CM | POA: Insufficient documentation

## 2013-12-03 DIAGNOSIS — M25469 Effusion, unspecified knee: Secondary | ICD-10-CM | POA: Insufficient documentation

## 2013-12-03 DIAGNOSIS — E785 Hyperlipidemia, unspecified: Secondary | ICD-10-CM

## 2013-12-03 DIAGNOSIS — I10 Essential (primary) hypertension: Secondary | ICD-10-CM

## 2013-12-03 DIAGNOSIS — M25561 Pain in right knee: Secondary | ICD-10-CM

## 2013-12-03 LAB — POCT GLYCOSYLATED HEMOGLOBIN (HGB A1C): Hemoglobin A1C: 5.6

## 2013-12-03 MED ORDER — IBUPROFEN 800 MG PO TABS: 800.0000 mg | ORAL_TABLET | Freq: Three times a day (TID) | ORAL | Status: DC | PRN

## 2013-12-03 MED ORDER — TRAMADOL HCL 50 MG PO TABS: 50.0000 mg | ORAL_TABLET | Freq: Two times a day (BID) | ORAL | Status: DC | PRN

## 2013-12-03 NOTE — Progress Notes (Signed)
Subjective:    Patient ID: Maureen [name], female    DOB: 21-Apr-1933, 79 y.o.   MRN: [id number]  HPI  Maureen [name] is here today to discuss the following conditions:  1)  Hypertension:  Her pressure is up today but she feels that it is due to the fact that she encountered a lot of traffic on her way into the office this morning.    2)   Anxiety:  She started taking Lexapro after her last visit but she felt very irritable on it so she stopped taking it.    3)  Knee Pain:  She is complaining of pain in both knees. She has been having increased pain with standing and walking over the past 6 months. She uses the Voltaren Gel and Motrin for the pain which helps for a little while.    Review of Systems  Constitutional: Negative.   HENT: Negative.   Eyes: Negative.   Respiratory: Negative.   Cardiovascular: Negative.   Gastrointestinal: Negative.   Endocrine: Negative.  Negative for polydipsia, polyphagia and polyuria.  Genitourinary: Negative.   Musculoskeletal:       Pain in both knees ongoing for 6 months  Skin: Negative.   Allergic/Immunologic: Negative.   Neurological: Negative.   Hematological: Negative.   Psychiatric/Behavioral: Negative for sleep disturbance, dysphoric mood and agitation. The patient is not nervous/anxious.      Past Medical History  Diagnosis Date  . Hyperlipidemia   . Hypertension   . IFG (impaired fasting glucose)   . Anemia   . Gout   . Diabetes mellitus without complication      Past Surgical History  Procedure Laterality Date  . Abdominal hysterectomy       History   Social History Narrative   Marital Status: Widowed    Children:  Sons Jillyn Hidden, Casimiro Needle and [place])  Daughter Elinor Dodge, [place], Okey Regal and [place])    Pets: None    Living Situation: Lives alone     Occupation:  Retired    Education: 11th Grade    Tobacco Use/Exposure:  None    Alcohol Use:  Occasional   Drug Use:  None   Diet:  Regular   Exercise:  None   Hobbies:   Crafting and sewing.               Family History  Problem Relation Age of Onset  . Stroke Mother   . Cancer Brother     prostate     Current Outpatient Prescriptions on File Prior to Visit  Medication Sig Dispense Refill  . allopurinol (ZYLOPRIM) 300 MG tablet Take 1 tablet (300 mg total) by mouth daily.  90 tablet  3  . amLODipine (NORVASC) 10 MG tablet Take 10 mg by mouth daily.      Marland Kitchen aspirin 81 MG tablet Take 81 mg by mouth daily.      Marland Kitchen atenolol (TENORMIN) 50 MG tablet Take 1 tablet (50 mg total) by mouth daily.  90 tablet  3  . atorvastatin (LIPITOR) 20 MG tablet Take 20 mg by mouth daily.      . Canagliflozin (INVOKANA) 300 MG TABS Take 1 tablet by mouth daily before breakfast.  90 tablet  1  . colchicine 0.6 MG tablet Take 1 tablet (0.6 mg total) by mouth 2 (two) times daily.  60 tablet  2  . glipiZIDE (GLUCOTROL XL) 2.5 MG 24 hr tablet Take 2.5 mg by mouth daily.      Marland Kitchen olmesartan-hydrochlorothiazide (BENICAR  HCT) 40-12.5 MG per tablet Take 1 tablet by mouth daily.       No current facility-administered medications on file prior to visit.     No Known Allergies      Objective:   Physical Exam  Nursing note and vitals reviewed. Constitutional: She is oriented to person, place, and time. She appears well-developed and well-nourished.  HENT:  Head: Normocephalic.  Right Ear: External ear normal.  Left Ear: External ear normal.  Eyes: Conjunctivae are normal. Pupils are equal, round, and reactive to light.  Neck: Normal range of motion.  Cardiovascular: Normal rate and normal heart sounds.   Pulmonary/Chest: Effort normal and breath sounds normal.  Abdominal: Soft. Bowel sounds are normal.  Musculoskeletal:       Right knee: Tenderness found.       Left knee: Tenderness found.  Neurological: She is alert and oriented to person, place, and time. She has normal reflexes.  Skin: Skin is warm and dry.  Psychiatric: She has a normal mood and affect. Her behavior  is normal. Judgment and thought content normal.      Assessment & Plan:    Maureen [name] was seen today for medication management.  Diagnoses and associated orders for this visit:  Essential hypertension, benign  Other and unspecified hyperlipidemia  Impaired fasting glucose - POCT glycosylated hemoglobin (Hb A1C) (5.6%)    Knee pain, bilateral - DG Knee Complete 4 Views Left - DG Knee Complete 4 Views Right - ibuprofen (ADVIL,MOTRIN) 800 MG tablet; Take 1 tablet (800 mg total) by mouth every 8 (eight) hours as needed (Take for knee pain). - traMADol (ULTRAM) 50 MG tablet; Take 1 tablet (50 mg total) by mouth every 12 (twelve) hours as needed.  TIME SPENT "FACE TO FACE" WITH PATIENT -  30 MINS

## 2013-12-03 NOTE — Patient Instructions (Addendum)
1)  Knee Pain -   Take 800 mg of Ibuprofen up to 3 times per day Add Tylenol 1000 mg 3 times per day Tramadol 50 mg 2 times per day  Get some Glucosamine 1500 mg (it will be mixed with other stuff like chondroitin etc.)  One example is BioFlex     We'll go over your xrays at your next visit and may do an injection in one knee.  Have Okey Regal or someone bring you.  If your xrays look bad then we'll send you to an orthopedist.    2)  BP - Check your pressure 3 x per week .  Bring in the readings and your machine to your next visit.   3)  Medications - BRING IN EVERY MEDICINE BOTTLE YOU HAVE SO WE CAN GET RID OF WHAT YOU DON'T NEED.  4)  Back Pain - You may want to see Dr. Luisa Dago (Chiropractor) located at the [facility] to get an adjustment.      Knee Pain Knee pain can be a result of an injury or other medical conditions. Treatment will depend on the cause of your pain. HOME CARE  Only take medicine as told by your doctor.  Keep a healthy weight. Being overweight can make the knee hurt more.  Stretch before exercising or playing sports.  If there is constant knee pain, change the way you exercise. Ask your doctor for advice.  Make sure shoes fit well. Choose the right shoe for the sport or activity.  Protect your knees. Wear kneepads if needed.  Rest when you are tired. GET HELP RIGHT AWAY IF:   Your knee pain does not stop.  Your knee pain does not get better.  Your knee joint feels hot to the touch.  You have a fever. MAKE SURE YOU:   Understand these instructions.  Will watch this condition. Will get help right away if you are not doing well or get worse.      ExitCare Patient Information 2014 [place], [place].

## 2013-12-17 ENCOUNTER — Ambulatory Visit (INDEPENDENT_AMBULATORY_CARE_PROVIDER_SITE_OTHER): Payer: Medicare Other | Admitting: Family Medicine

## 2013-12-17 ENCOUNTER — Encounter: Payer: Self-pay | Admitting: Family Medicine

## 2013-12-17 VITALS — BP 175/82 | HR 69 | Resp 16 | Wt 170.0 lb

## 2013-12-17 DIAGNOSIS — G8929 Other chronic pain: Secondary | ICD-10-CM

## 2013-12-17 DIAGNOSIS — K219 Gastro-esophageal reflux disease without esophagitis: Secondary | ICD-10-CM

## 2013-12-17 DIAGNOSIS — M25569 Pain in unspecified knee: Secondary | ICD-10-CM

## 2013-12-17 DIAGNOSIS — I10 Essential (primary) hypertension: Secondary | ICD-10-CM

## 2013-12-17 MED ORDER — PANTOPRAZOLE SODIUM 40 MG PO TBEC: DELAYED_RELEASE_TABLET | ORAL | Status: DC

## 2013-12-17 NOTE — Patient Instructions (Addendum)
1)  Knee Pain - Try to limit your Ibuprofen to 1-2 per day and add some of the tramadol 50 mg up to twice a day.  Let us know if you want [place] to send you to the orthopedist who specializes in the knees (Dr. Charlann Boxer).    2)  Stomach Pain - I am sending in a medication called pantoprazole for you to take 1 per day to protect your stomach from the Ibuprofen.

## 2013-12-17 NOTE — Progress Notes (Signed)
Subjective:    Patient ID: Maureen [name], female    DOB: Aug 25, 1933, 79 y.o.   MRN: [id number]  HPI  Flornce is here today to follow up on her right knee pain.  She has been taking Ibuprofen which she says has really helped so she has decided that she does not want to get an injection today.  Her blood pressure is high today however she is very upset and feels that this is the reason it is high.  Her power went out last night and she has no heat at home right now. She was also anxious about getting the knee injection.     Review of Systems  Musculoskeletal:       Right knee pain  Psychiatric/Behavioral: The patient is nervous/anxious.   All other systems reviewed and are negative.   Past Medical History  Diagnosis Date  . Hyperlipidemia   . Hypertension   . IFG (impaired fasting glucose)   . Anemia   . Gout   . Diabetes mellitus without complication      Past Surgical History  Procedure Laterality Date  . Abdominal hysterectomy       History   Social History Narrative   Marital Status: Widowed    Children:  Sons Jillyn Hidden, Casimiro Needle and [place])  Daughter Elinor Dodge, [place], Okey Regal and [place])    Pets: None    Living Situation: Lives alone     Occupation:  Retired    Education: 11th Grade    Tobacco Use/Exposure:  None    Alcohol Use:  Occasional   Drug Use:  None   Diet:  Regular   Exercise:  None   Hobbies:  Crafting and sewing.               Family History  Problem Relation Age of Onset  . Stroke Mother   . Cancer Brother     prostate     Current Outpatient Prescriptions on File Prior to Visit  Medication Sig Dispense Refill  . allopurinol (ZYLOPRIM) 300 MG tablet Take 1 tablet (300 mg total) by mouth daily.  90 tablet  3  . amLODipine (NORVASC) 10 MG tablet Take 10 mg by mouth daily.      Marland Kitchen aspirin 81 MG tablet Take 81 mg by mouth daily.      Marland Kitchen atenolol (TENORMIN) 50 MG tablet Take 1 tablet (50 mg total) by mouth daily.  90 tablet  3  . atorvastatin  (LIPITOR) 20 MG tablet Take 20 mg by mouth daily.      . colchicine 0.6 MG tablet Take 1 tablet (0.6 mg total) by mouth 2 (two) times daily.  60 tablet  2  . glipiZIDE (GLUCOTROL XL) 2.5 MG 24 hr tablet Take 2.5 mg by mouth daily.      Marland Kitchen ibuprofen (ADVIL,MOTRIN) 800 MG tablet Take 1 tablet (800 mg total) by mouth every 8 (eight) hours as needed (Take for knee pain).  270 tablet  1  . olmesartan-hydrochlorothiazide (BENICAR HCT) 40-12.5 MG per tablet Take 1 tablet by mouth daily.      . Canagliflozin (INVOKANA) 300 MG TABS Take 1 tablet by mouth daily before breakfast.  90 tablet  1   No current facility-administered medications on file prior to visit.     No Known Allergies    There is no immunization history on file for this patient.      Objective:   Physical Exam  Constitutional: She appears well-nourished. No distress.  HENT:  Head: Normocephalic.  Eyes: No scleral icterus.  Neck: No thyromegaly present.  Cardiovascular: Normal rate, regular rhythm and normal heart sounds.   Pulmonary/Chest: Effort normal and breath sounds normal.  Abdominal: There is no tenderness.  Musculoskeletal: She exhibits no edema and no tenderness.  Neurological: She is alert.  Skin: Skin is warm and dry.  Psychiatric: She has a normal mood and affect. Her behavior is normal. Judgment and thought content normal.      Assessment & Plan:    Yeila was seen today for knee pain.  Diagnoses and associated orders for this visit:  Knee pain, chronic, right Comments: Her knee pain has improved with the ibuprofen so we'll hold on the injection for now.  She is to limit her intake of ibuprofen and is to add some tramadol.    Essential hypertension, benign Comments: Her BP is much better at home.   GERD (gastroesophageal reflux disease) Comments: She is going to start taking pantoprazole along with the ibuprofen to protect her stomach.   - pantoprazole (PROTONIX) 40 MG tablet; Take 1 tab daily to  protect stomach.

## 2013-12-24 LAB — HM DIABETES EYE EXAM

## 2013-12-25 ENCOUNTER — Encounter: Payer: Self-pay | Admitting: *Deleted

## 2014-03-14 ENCOUNTER — Other Ambulatory Visit: Payer: Self-pay | Admitting: *Deleted

## 2014-03-14 DIAGNOSIS — I10 Essential (primary) hypertension: Secondary | ICD-10-CM

## 2014-03-14 MED ORDER — AMLODIPINE BESYLATE 10 MG PO TABS: 10.0000 mg | ORAL_TABLET | Freq: Every day | ORAL | Status: DC

## 2014-03-14 MED ORDER — OLMESARTAN MEDOXOMIL-HCTZ 40-12.5 MG PO TABS: 1.0000 | ORAL_TABLET | Freq: Every day | ORAL | Status: DC

## 2014-03-14 MED ORDER — ATENOLOL 50 MG PO TABS: 50.0000 mg | ORAL_TABLET | Freq: Every day | ORAL | Status: DC

## 2014-04-04 ENCOUNTER — Encounter: Payer: Self-pay | Admitting: Family Medicine

## 2014-04-04 ENCOUNTER — Ambulatory Visit (INDEPENDENT_AMBULATORY_CARE_PROVIDER_SITE_OTHER): Payer: Medicare Other | Admitting: Family Medicine

## 2014-04-04 VITALS — BP 150/75 | HR 60 | Resp 16 | Ht 61.0 in | Wt 169.0 lb

## 2014-04-04 DIAGNOSIS — I10 Essential (primary) hypertension: Secondary | ICD-10-CM

## 2014-04-04 DIAGNOSIS — E119 Type 2 diabetes mellitus without complications: Secondary | ICD-10-CM

## 2014-04-04 DIAGNOSIS — E785 Hyperlipidemia, unspecified: Secondary | ICD-10-CM

## 2014-04-04 DIAGNOSIS — M170 Bilateral primary osteoarthritis of knee: Secondary | ICD-10-CM

## 2014-04-04 DIAGNOSIS — IMO0002 Reserved for concepts with insufficient information to code with codable children: Secondary | ICD-10-CM

## 2014-04-04 DIAGNOSIS — M1710 Unilateral primary osteoarthritis, unspecified knee: Secondary | ICD-10-CM

## 2014-04-04 LAB — POCT GLYCOSYLATED HEMOGLOBIN (HGB A1C): Hemoglobin A1C: 5.9

## 2014-04-04 MED ORDER — CELECOXIB 200 MG PO CAPS: 200.0000 mg | ORAL_CAPSULE | Freq: Two times a day (BID) | ORAL | Status: DC

## 2014-04-04 MED ORDER — GLIPIZIDE ER 2.5 MG PO TB24: 2.5000 mg | ORAL_TABLET | Freq: Every day | ORAL | Status: DC

## 2014-04-04 MED ORDER — ATORVASTATIN CALCIUM 20 MG PO TABS: 20.0000 mg | ORAL_TABLET | Freq: Every day | ORAL | Status: DC

## 2014-04-04 NOTE — Patient Instructions (Signed)
1)  Knee Pain - I sent in a prescription for Celebrex to [facility].  You will take this 1-2 times per day but don't take it with the Ibuprofen since they are the same type of medication. You can take take the Celebrex or the Ibuprofen with Tylenol 1000 mg if you need extra help.   2)  Anxiety - If you have an episode of feeling more anxious, you could take one of the hydroxyzine 25 mg up to twice a day if needed.    3)  Blood Sugar - Your A1c is still good at 5.9% so stay on the Glipizide 2.5 mg daily.  If you have a day that you know you are going to "be bad" you could take 2 of the Glipizides.

## 2014-04-04 NOTE — Progress Notes (Signed)
Subjective:    Patient ID: Maureen [name], female    DOB: 04-14-1933, 80 y.o.   MRN: [id number]  HPI  Maureen [name] is here today to get some medication refills and to follow up on the following issues:  1)  Type II DM:  She is taking Glipzide for her diabetes. She never had the Invokana filled. Her A1c is 5.9.   2)  Hypertension:  She is doing well on the Norvasc (1/2 of the 10 mg),  Benicar 40/12.5, and atenolol 50 mg. She has been keeping a log of her BP readings at home and they have been normal.    3)  Hyperlipidemia:  She is doing well on the Lipitor (20 mg).  She is needing a refill on this.  4)  Anxiety:  She has prescriptions for escitalopram and hydroxzine but she has not been taking them. She feels that she is doing fine without them.    5)  Knee Pain:  She has been taking Ibuprofen 800 mg. She feels nauseated when she takes it. She liked the Celebrex samples she took in the past better.   6)  Gout:  We checked a uric acid in 2/14 when she was having a lot of pain and it was 8.0. She was started on Colchicine and allopurinol at that time and we got her uric acid down to 4.9. She has changed her diet and cut back on sweets/breads and her pain is better so she stopped the allopurinol.     Review of Systems  Constitutional: Negative for activity change and appetite change.  Cardiovascular: Negative for chest pain, palpitations and leg swelling.  Endocrine: Negative for polydipsia, polyphagia and polyuria.  Musculoskeletal:       Right Knee pain  Psychiatric/Behavioral: Negative for behavioral problems and sleep disturbance. The patient is not nervous/anxious.   All other systems reviewed and are negative.     Past Medical History  Diagnosis Date  . Hyperlipidemia   . Hypertension   . IFG (impaired fasting glucose)   . Anemia   . Gout   . Diabetes mellitus without complication   . GERD (gastroesophageal reflux disease)   . Gout   . Anxiety      Past Surgical History    Procedure Laterality Date  . Abdominal hysterectomy       History   Social History Narrative   Marital Status: Widowed    Children:  Sons Jillyn Hidden, Casimiro Needle and [place])  Daughter Elinor Dodge, [place], Okey Regal and [place])    Pets: None    Living Situation: Lives alone     Occupation:  Retired    Education: 11th Grade    Tobacco Use/Exposure:  None    Alcohol Use:  Occasional   Drug Use:  None   Diet:  Regular   Exercise:  None   Hobbies:  Crafting and sewing.               Family History  Problem Relation Age of Onset  . Stroke Mother   . Cancer Brother     prostate     Current Outpatient Prescriptions on File Prior to Visit  Medication Sig Dispense Refill  . amLODipine (NORVASC) 10 MG tablet Take 1 tablet (10 mg total) by mouth daily.  30 tablet  0  . aspirin 81 MG tablet Take 81 mg by mouth daily.      . cholecalciferol (VITAMIN D) 1000 UNITS tablet Take 1,000 Units by mouth daily.      Marland Kitchen  Cod Liver Oil CAPS Take 1 capsule by mouth daily.      Marland Kitchen. escitalopram (LEXAPRO) 5 MG tablet Take 5 mg by mouth daily.      . hydrOXYzine (ATARAX/VISTARIL) 25 MG tablet Take 25 mg by mouth 2 (two) times daily as needed.      Marland Kitchen. ibuprofen (ADVIL,MOTRIN) 800 MG tablet Take 1 tablet (800 mg total) by mouth every 8 (eight) hours as needed (Take for knee pain).  270 tablet  1  . indomethacin (INDOCIN SR) 75 MG CR capsule Take 75 mg by mouth 2 (two) times daily with a meal.      . meclizine (ANTIVERT) 25 MG tablet Take 25 mg by mouth 3 (three) times daily as needed for dizziness.      . Multiple Vitamin (MULTIVITAMIN) tablet Take 1 tablet by mouth daily.      Marland Kitchen. olmesartan-hydrochlorothiazide (BENICAR HCT) 40-12.5 MG per tablet Take 1 tablet by mouth daily.  30 tablet  0  . pantoprazole (PROTONIX) 40 MG tablet Take 1 tab daily to protect stomach.  90 tablet  3  . vitamin B-12 (CYANOCOBALAMIN) 500 MCG tablet Take 500 mcg by mouth daily.      . Canagliflozin (INVOKANA) 300 MG TABS Take 1 tablet by  mouth daily before breakfast.  90 tablet  1   No current facility-administered medications on file prior to visit.     No Known Allergies      Objective:   Physical Exam  Nursing note and vitals reviewed. Constitutional: She appears well-nourished. No distress.  HENT:  Head: Normocephalic.  Eyes: No scleral icterus.  Neck: No thyromegaly present.  Cardiovascular: Normal rate, regular rhythm and normal heart sounds.   Pulmonary/Chest: Effort normal and breath sounds normal.  Abdominal: There is no tenderness.  Musculoskeletal: She exhibits no edema and no tenderness.  Neurological: She is alert.  Skin: Skin is warm and dry.  Psychiatric: She has a normal mood and affect. Her behavior is normal. Judgment and thought content normal.      Assessment & Plan:    Maureen [name] was seen today for medication management.  Her A1c is well controlled.  She was given medication refills.    Diagnoses and associated orders for this visit:  Type II or unspecified type diabetes mellitus without mention of complication, not stated as uncontrolled - POCT HgB A1C (5.9%)  - glipiZIDE (GLUCOTROL XL) 2.5 MG 24 hr tablet; Take 1 tablet (2.5 mg total) by mouth daily.  Essential hypertension, benign  Osteoarthritis of both knees - celecoxib (CELEBREX) 200 MG capsule; Take 1 capsule (200 mg total) by mouth 2 (two) times daily.  Other and unspecified hyperlipidemia - atorvastatin (LIPITOR) 20 MG tablet; Take 1 tablet (20 mg total) by mouth daily.  TIME SPENT "FACE TO FACE" WITH PATIENT -  30 MINS

## 2014-04-17 ENCOUNTER — Other Ambulatory Visit: Payer: Self-pay | Admitting: Family Medicine

## 2014-04-17 NOTE — Telephone Encounter (Signed)
She was given a 90 day supply.  Her next appt is on 05/27/14. PG

## 2014-04-23 ENCOUNTER — Other Ambulatory Visit: Payer: Self-pay | Admitting: *Deleted

## 2014-04-25 ENCOUNTER — Other Ambulatory Visit: Payer: Self-pay | Admitting: Family Medicine

## 2014-05-03 ENCOUNTER — Other Ambulatory Visit: Payer: Self-pay | Admitting: Family Medicine

## 2014-05-22 ENCOUNTER — Other Ambulatory Visit: Payer: Medicare Other

## 2014-05-23 ENCOUNTER — Other Ambulatory Visit: Payer: Self-pay | Admitting: *Deleted

## 2014-05-23 DIAGNOSIS — E785 Hyperlipidemia, unspecified: Secondary | ICD-10-CM

## 2014-05-23 DIAGNOSIS — I10 Essential (primary) hypertension: Secondary | ICD-10-CM

## 2014-05-26 ENCOUNTER — Other Ambulatory Visit: Payer: Medicare Other

## 2014-05-27 ENCOUNTER — Ambulatory Visit: Payer: Medicare Other | Admitting: Family Medicine

## 2014-06-06 ENCOUNTER — Other Ambulatory Visit: Payer: Self-pay | Admitting: *Deleted

## 2014-06-09 ENCOUNTER — Other Ambulatory Visit: Payer: Medicare Other

## 2014-06-09 LAB — COMPLETE METABOLIC PANEL WITH GFR
ALT: 15 U/L (ref 0–35)
AST: 21 U/L (ref 0–37)
Albumin: 4.1 g/dL (ref 3.5–5.2)
Alkaline Phosphatase: 56 U/L (ref 39–117)
BUN: 24 mg/dL — ABNORMAL HIGH (ref 6–23)
CO2: 26 mEq/L (ref 19–32)
Calcium: 9.7 mg/dL (ref 8.4–10.5)
Chloride: 103 mEq/L (ref 96–112)
Creat: 1.12 mg/dL — ABNORMAL HIGH (ref 0.50–1.10)
GFR, Est African American: 54 mL/min — ABNORMAL LOW
GFR, Est Non African American: 47 mL/min — ABNORMAL LOW
Glucose, Bld: 110 mg/dL — ABNORMAL HIGH (ref 70–99)
Potassium: 4.1 mEq/L (ref 3.5–5.3)
Sodium: 139 mEq/L (ref 135–145)
Total Bilirubin: 0.4 mg/dL (ref 0.2–1.2)
Total Protein: 7.5 g/dL (ref 6.0–8.3)

## 2014-06-09 LAB — LIPID PANEL
Cholesterol: 251 mg/dL — ABNORMAL HIGH (ref 0–200)
HDL: 66 mg/dL (ref >39)
LDL Cholesterol: 166 mg/dL — ABNORMAL HIGH (ref 0–99)
Total CHOL/HDL Ratio: 3.8 Ratio
Triglycerides: 97 mg/dL (ref <150)
VLDL: 19 mg/dL (ref 0–40)

## 2014-06-09 LAB — CBC WITH DIFFERENTIAL/PLATELET
Basophils Absolute: 0.1 10*3/uL (ref 0.0–0.1)
Basophils Relative: 1 % (ref 0–1)
Eosinophils Absolute: 0.2 10*3/uL (ref 0.0–0.7)
Eosinophils Relative: 3 % (ref 0–5)
HCT: 40.7 % (ref 36.0–46.0)
Hemoglobin: 13.7 g/dL (ref 12.0–15.0)
Lymphocytes Relative: 30 % (ref 12–46)
Lymphs Abs: 1.9 10*3/uL (ref 0.7–4.0)
MCH: 26.9 pg (ref 26.0–34.0)
MCHC: 33.7 g/dL (ref 30.0–36.0)
MCV: 79.8 fL (ref 78.0–100.0)
Monocytes Absolute: 0.6 10*3/uL (ref 0.1–1.0)
Monocytes Relative: 10 % (ref 3–12)
Neutro Abs: 3.6 10*3/uL (ref 1.7–7.7)
Neutrophils Relative %: 56 % (ref 43–77)
Platelets: 238 10*3/uL (ref 150–400)
RBC: 5.1 MIL/uL (ref 3.87–5.11)
RDW: 16.2 % — ABNORMAL HIGH (ref 11.5–15.5)
WBC: 6.4 10*3/uL (ref 4.0–10.5)

## 2014-06-15 DIAGNOSIS — E119 Type 2 diabetes mellitus without complications: Secondary | ICD-10-CM | POA: Insufficient documentation

## 2014-06-15 DIAGNOSIS — M170 Bilateral primary osteoarthritis of knee: Secondary | ICD-10-CM | POA: Insufficient documentation

## 2014-06-16 ENCOUNTER — Encounter: Payer: Self-pay | Admitting: Family Medicine

## 2014-06-16 ENCOUNTER — Ambulatory Visit (INDEPENDENT_AMBULATORY_CARE_PROVIDER_SITE_OTHER): Payer: Medicare Other | Admitting: Family Medicine

## 2014-06-16 VITALS — BP 157/73 | HR 62 | Resp 16 | Ht 61.25 in | Wt 169.0 lb

## 2014-06-16 DIAGNOSIS — K219 Gastro-esophageal reflux disease without esophagitis: Secondary | ICD-10-CM

## 2014-06-16 DIAGNOSIS — IMO0002 Reserved for concepts with insufficient information to code with codable children: Secondary | ICD-10-CM

## 2014-06-16 DIAGNOSIS — M170 Bilateral primary osteoarthritis of knee: Secondary | ICD-10-CM

## 2014-06-16 DIAGNOSIS — I10 Essential (primary) hypertension: Secondary | ICD-10-CM

## 2014-06-16 DIAGNOSIS — M1710 Unilateral primary osteoarthritis, unspecified knee: Secondary | ICD-10-CM

## 2014-06-16 DIAGNOSIS — E119 Type 2 diabetes mellitus without complications: Secondary | ICD-10-CM

## 2014-06-16 MED ORDER — PANTOPRAZOLE SODIUM 40 MG PO TBEC: DELAYED_RELEASE_TABLET | ORAL | Status: DC

## 2014-06-16 MED ORDER — CELECOXIB 200 MG PO CAPS: 200.0000 mg | ORAL_CAPSULE | Freq: Two times a day (BID) | ORAL | Status: AC

## 2014-06-16 MED ORDER — ATENOLOL 50 MG PO TABS: 50.0000 mg | ORAL_TABLET | Freq: Every day | ORAL | Status: DC

## 2014-06-16 MED ORDER — GLIPIZIDE ER 2.5 MG PO TB24: 2.5000 mg | ORAL_TABLET | Freq: Every day | ORAL | Status: DC

## 2014-06-16 NOTE — Patient Instructions (Addendum)
1)  Blood Pressure - Stay on the Benicar HCT 40/12.5, Amlodipine 5 mg (1/2 of the 10) and atenolol 50 mg.    2)  Blood Sugar - Glipizide XL 2.5 mg  3)  Arthritis - Take the Celebrex 1-2 times per day and hold the Ibuprofen or take 1 Celebrex and 1 Ibuprofen.    4)  Stomach - Take the Protonix - I have referred you to Dr. Leone PayorGessner for him to do an EGD and/or colonoscopy.    5)  Cholesterol - Your LDL (Bad Choleserol) was too high on the atorvastatin alone so let's try you on the Vytorin 10/20 daily for 2 months then increase to the 10/40 daily and then we'll recheck your cholesterol level.    Fat and Cholesterol Control Diet Fat and cholesterol levels in your blood and organs are influenced by your diet. High levels of fat and cholesterol may lead to diseases of the heart, small and large blood vessels, gallbladder, liver, and pancreas. CONTROLLING FAT AND CHOLESTEROL WITH DIET Although exercise and lifestyle factors are important, your diet is key. That is because certain foods are known to raise cholesterol and others to lower it. The goal is to balance foods for their effect on cholesterol and more importantly, to replace saturated and trans fat with other types of fat, such as monounsaturated fat, polyunsaturated fat, and omega-3 fatty acids. On average, a person should consume no more than 15 to 17 g of saturated fat daily. Saturated and trans fats are considered "bad" fats, and they will raise LDL cholesterol. Saturated fats are primarily found in animal products such as meats, butter, and cream. However, that does not mean you need to give up all your favorite foods. Today, there are good tasting, low-fat, low-cholesterol substitutes for most of the things you like to eat. Choose low-fat or nonfat alternatives. Choose round or loin cuts of red meat. These types of cuts are lowest in fat and cholesterol. Chicken (without the skin), fish, veal, and ground [place]key breast are great choices. Eliminate  fatty meats, such as hot dogs and salami. Even shellfish have little or no saturated fat. Have a 3 oz (85 g) portion when you eat lean meat, poultry, or fish. Trans fats are also called "partially hydrogenated oils." They are oils that have been scientifically manipulated so that they are solid at room temperature resulting in a longer shelf life and improved taste and texture of foods in which they are added. Trans fats are found in stick margarine, some tub margarines, cookies, crackers, and baked goods.  When baking and cooking, oils are a great substitute for butter. The monounsaturated oils are especially beneficial since it is believed they lower LDL and raise HDL. The oils you should avoid entirely are saturated tropical oils, such as coconut and palm.  Remember to eat a lot from food groups that are naturally free of saturated and trans fat, including fish, fruit, vegetables, beans, grains (barley, rice, couscous, bulgur wheat), and pasta (without cream sauces).  IDENTIFYING FOODS THAT LOWER FAT AND CHOLESTEROL  Soluble fiber may lower your cholesterol. This type of fiber is found in fruits such as apples, vegetables such as broccoli, potatoes, and carrots, legumes such as beans, peas, and lentils, and grains such as barley. Foods fortified with plant sterols (phytosterol) may also lower cholesterol. You should eat at least 2 g per day of these foods for a cholesterol lowering effect.  Read package labels to identify low-saturated fats, trans fat free, and  low-fat foods at the supermarket. Select cheeses that have only 2 to 3 g saturated fat per ounce. Use a heart-healthy tub margarine that is free of trans fats or partially hydrogenated oil. When buying baked goods (cookies, crackers), avoid partially hydrogenated oils. Breads and muffins should be made from whole grains (whole-wheat or whole oat flour, instead of "flour" or "enriched flour"). Buy non-creamy canned soups with reduced salt and no added  fats.  FOOD PREPARATION TECHNIQUES  Never deep-fry. If you must fry, either stir-fry, which uses very little fat, or use non-stick cooking sprays. When possible, broil, bake, or roast meats, and steam vegetables. Instead of putting butter or margarine on vegetables, use lemon and herbs, applesauce, and cinnamon (for squash and sweet potatoes). Use nonfat yogurt, salsa, and low-fat dressings for salads.  LOW-SATURATED FAT / LOW-FAT FOOD SUBSTITUTES Meats / Saturated Fat (g)  Avoid: Steak, marbled (3 oz/85 g) / 11 g  Choose: Steak, lean (3 oz/85 g) / 4 g  Avoid: Hamburger (3 oz/85 g) / 7 g  Choose: Hamburger, lean (3 oz/85 g) / 5 g  Avoid: Ham (3 oz/85 g) / 6 g  Choose: Ham, lean cut (3 oz/85 g) / 2.4 g  Avoid: Chicken, with skin, dark meat (3 oz/85 g) / 4 g  Choose: Chicken, skin removed, dark meat (3 oz/85 g) / 2 g  Avoid: Chicken, with skin, light meat (3 oz/85 g) / 2.5 g  Choose: Chicken, skin removed, light meat (3 oz/85 g) / 1 g Dairy / Saturated Fat (g)  Avoid: Whole milk (1 cup) / 5 g  Choose: Low-fat milk, 2% (1 cup) / 3 g  Choose: Low-fat milk, 1% (1 cup) / 1.5 g  Choose: Skim milk (1 cup) / 0.3 g  Avoid: Hard cheese (1 oz/28 g) / 6 g  Choose: Skim milk cheese (1 oz/28 g) / 2 to 3 g  Avoid: Cottage cheese, 4% fat (1 cup) / 6.5 g  Choose: Low-fat cottage cheese, 1% fat (1 cup) / 1.5 g  Avoid: Ice cream (1 cup) / 9 g  Choose: Sherbet (1 cup) / 2.5 g  Choose: Nonfat frozen yogurt (1 cup) / 0.3 g  Choose: Frozen fruit bar / trace  Avoid: Whipped cream (1 tbs) / 3.5 g  Choose: Nondairy whipped topping (1 tbs) / 1 g Condiments / Saturated Fat (g)  Avoid: Mayonnaise (1 tbs) / 2 g  Choose: Low-fat mayonnaise (1 tbs) / 1 g  Avoid: Butter (1 tbs) / 7 g  Choose: Extra light margarine (1 tbs) / 1 g  Avoid: Coconut oil (1 tbs) / 11.8 g  Choose: Olive oil (1 tbs) / 1.8 g  Choose: Corn oil (1 tbs) / 1.7 g  Choose: Safflower oil (1 tbs) / 1.2 g  Choose:  Sunflower oil (1 tbs) / 1.4 g  Choose: Soybean oil (1 tbs) / 2.4 g  Choose: Canola oil (1 tbs) / 1 g Document Released: 12/12/2005 Document Revised: 04/08/2013 Document Reviewed: 06/02/2011 Exit[employer] Patient Information 2015 [employer]. This information is not intended to replace advice given to you by your health care provider. Make sure you discuss any questions you have with your health care provider.

## 2014-06-16 NOTE — Progress Notes (Signed)
Subjective:    Patient ID: Maureen [name], female    DOB: 07/21/1933, 80 y.o.   MRN: [id number]  HPI  Maureen [name] is here today to go over her recent lab results. She is needing to get some medication refills.  1)  IFG:  She is taking the metformin but does not think she is taking Invokana. She does not have a bottle for it.   2)  Hypertension:  She is taking Benicar HCT, atenolol and amlodipine. Her BP is elevated today but she believes it because her car's check engine light came on when she was on her way here and that upset her.   3)  Hyperlipidemia:  She is taking Lipitor and is doing well on it.  4)  Left Knee Pain:  She is taking Ibuprofen 800mg  but it has been bothering her stomach. She wold like to try something that wouldn't bother her stomach so much.    Review of Systems  Constitutional: Negative for activity change, appetite change and fatigue.  Cardiovascular: Negative for chest pain, palpitations and leg swelling.  Endocrine: Negative for polydipsia, polyphagia and polyuria.  Musculoskeletal:       Left knee pain  Psychiatric/Behavioral: Negative for behavioral problems and sleep disturbance. The patient is not nervous/anxious.   All other systems reviewed and are negative.   Past Medical History  Diagnosis Date  . Hyperlipidemia   . Hypertension   . IFG (impaired fasting glucose)   . Anemia   . Gout   . Diabetes mellitus without complication   . GERD (gastroesophageal reflux disease)   . Gout   . Anxiety      Past Surgical History  Procedure Laterality Date  . Abdominal hysterectomy       History   Social History Narrative   Marital Status: Widowed    Children:  Sons Jillyn Hidden(Gary, Casimiro NeedleMichael and BelleLamar)  Daughter Elinor Dodge(Gwendolyn, Cross TimbersDelana, Okey Regalarol and AtkinsSylatheia)    Pets: None    Living Situation: Lives alone     Occupation:  Retired    Education: 11th Grade    Tobacco Use/Exposure:  None    Alcohol Use:  Occasional   Drug Use:  None   Diet:  Regular   Exercise:  None    Hobbies:  Crafting and sewing.               Family History  Problem Relation Age of Onset  . Stroke Mother   . Cancer Brother     prostate     Current Outpatient Prescriptions on File Prior to Visit  Medication Sig Dispense Refill  . amLODipine (NORVASC) 10 MG tablet Take 1 tablet (10 mg total) by mouth daily.  30 tablet  0  . aspirin 81 MG tablet Take 81 mg by mouth daily.      Marland Kitchen. atorvastatin (LIPITOR) 20 MG tablet Take 1 tablet (20 mg total) by mouth daily.  90 tablet  3  . cholecalciferol (VITAMIN D) 1000 UNITS tablet Take 1,000 Units by mouth daily.      Marland Kitchen. Cod Liver Oil CAPS Take 1 capsule by mouth daily.      Marland Kitchen. ibuprofen (ADVIL,MOTRIN) 800 MG tablet Take 1 tablet (800 mg total) by mouth every 8 (eight) hours as needed (Take for knee pain).  270 tablet  1  . Multiple Vitamin (MULTIVITAMIN) tablet Take 1 tablet by mouth daily.      Marland Kitchen. olmesartan-hydrochlorothiazide (BENICAR HCT) 40-12.5 MG per tablet Take 1 tablet by mouth daily.  30 tablet  0  . vitamin B-12 (CYANOCOBALAMIN) 500 MCG tablet Take 500 mcg by mouth daily.       No current facility-administered medications on file prior to visit.     No Known Allergies      Objective:   Physical Exam  Nursing note and vitals reviewed. Constitutional: She appears well-nourished. No distress.  HENT:  Head: Normocephalic.  Eyes: No scleral icterus.  Neck: No thyromegaly present.  Cardiovascular: Normal rate, regular rhythm and normal heart sounds.   Pulmonary/Chest: Effort normal and breath sounds normal.  Abdominal: There is no tenderness.  Musculoskeletal: She exhibits no edema and no tenderness.  Neurological: She is alert.  Skin: Skin is warm and dry.  Psychiatric: She has a normal mood and affect. Her behavior is normal. Judgment and thought content normal.      Assessment & Plan:    Maureen [name] was seen today for medication management.  Diagnoses and associated orders for this visit:  Essential hypertension,  benign - atenolol (TENORMIN) 50 MG tablet; Take 1 tablet (50 mg total) by mouth daily.  Osteoarthritis of both knees, unspecified osteoarthritis type - celecoxib (CELEBREX) 200 MG capsule; Take 1 capsule (200 mg total) by mouth 2 (two) times daily.  Gastroesophageal reflux disease without esophagitis - pantoprazole (PROTONIX) 40 MG tablet; Take 1 tab daily to protect stomach. - Ambulatory referral to Gastroenterology  Type II or unspecified type diabetes mellitus without mention of complication, not stated as uncontrolled - glipiZIDE (GLUCOTROL XL) 2.5 MG 24 hr tablet; Take 1 tablet (2.5 mg total) by mouth daily.   TIME SPENT "FACE TO FACE" WITH PATIENT -  30 MINS

## 2014-07-21 ENCOUNTER — Encounter: Payer: Self-pay | Admitting: Family Medicine

## 2015-01-23 ENCOUNTER — Other Ambulatory Visit: Payer: Self-pay

## 2015-01-23 DIAGNOSIS — Z1231 Encounter for screening mammogram for malignant neoplasm of breast: Secondary | ICD-10-CM

## 2015-01-30 ENCOUNTER — Ambulatory Visit
Admission: RE | Admit: 2015-01-30 | Discharge: 2015-01-30 | Disposition: A | Discharge status: Home / Self Care | Payer: Medicare Other | Source: Ambulatory Visit

## 2015-01-30 DIAGNOSIS — Z1231 Encounter for screening mammogram for malignant neoplasm of breast: Secondary | ICD-10-CM

## 2015-09-23 ENCOUNTER — Other Ambulatory Visit: Payer: Self-pay | Admitting: Orthopedic Surgery

## 2015-09-29 ENCOUNTER — Encounter (HOSPITAL_BASED_OUTPATIENT_CLINIC_OR_DEPARTMENT_OTHER): Payer: Self-pay | Admitting: *Deleted

## 2015-10-01 ENCOUNTER — Other Ambulatory Visit: Payer: Self-pay

## 2015-10-01 ENCOUNTER — Encounter (HOSPITAL_BASED_OUTPATIENT_CLINIC_OR_DEPARTMENT_OTHER)
Admission: RE | Admit: 2015-10-01 | Discharge: 2015-10-01 | Disposition: A | Discharge status: Home / Self Care | Payer: Medicare Other | Source: Ambulatory Visit | Attending: Orthopedic Surgery | Admitting: Orthopedic Surgery

## 2015-10-01 DIAGNOSIS — E785 Hyperlipidemia, unspecified: Secondary | ICD-10-CM | POA: Diagnosis not present

## 2015-10-01 DIAGNOSIS — I10 Essential (primary) hypertension: Secondary | ICD-10-CM | POA: Diagnosis not present

## 2015-10-01 DIAGNOSIS — Z01818 Encounter for other preprocedural examination: Secondary | ICD-10-CM | POA: Insufficient documentation

## 2015-10-01 DIAGNOSIS — G5603 Carpal tunnel syndrome, bilateral upper limbs: Secondary | ICD-10-CM | POA: Diagnosis not present

## 2015-10-01 DIAGNOSIS — K219 Gastro-esophageal reflux disease without esophagitis: Secondary | ICD-10-CM | POA: Diagnosis not present

## 2015-10-01 DIAGNOSIS — G5601 Carpal tunnel syndrome, right upper limb: Secondary | ICD-10-CM | POA: Diagnosis present

## 2015-10-01 DIAGNOSIS — E119 Type 2 diabetes mellitus without complications: Secondary | ICD-10-CM | POA: Diagnosis not present

## 2015-10-01 LAB — BASIC METABOLIC PANEL
Anion gap: 7 (ref 5–15)
BUN: 13 mg/dL (ref 6–20)
CO2: 29 mmol/L (ref 22–32)
CREATININE: 1.13 mg/dL — AB (ref 0.44–1.00)
Calcium: 9.5 mg/dL (ref 8.9–10.3)
Chloride: 101 mmol/L (ref 101–111)
GFR calc Af Amer: 52 mL/min — ABNORMAL LOW (ref >60)
GFR, EST NON AFRICAN AMERICAN: 45 mL/min — AB (ref >60)
GLUCOSE: 102 mg/dL — AB (ref 65–99)
Potassium: 4.7 mmol/L (ref 3.5–5.1)
SODIUM: 137 mmol/L (ref 135–145)

## 2015-10-01 NOTE — Progress Notes (Signed)
ekg cleared by  Dr Rose  

## 2015-10-02 NOTE — H&P (Signed)
Maureen [name] is an 81 y.o. female.   CC / Reason for Visit: Bilateral hand numbness and tingling HPI: This patient returns for reevaluation, having undergone NCS/EMG on 09-02-15, revealing very severe bilateral carpal tunnel syndrome.  She confirms that she has had difficulty sewing, often dropping needles, but is fearful of undergoing surgery.  HPI 07-29-15: This patient is an 81 year old female with diabetes who presents for evaluation of numbness and tingling that affects both hands.  It is been progressive and worsening over the past 3-4 years.  The right hand is the worst.  She also has weakness of pinch.  Past Medical History  Diagnosis Date  . Hyperlipidemia   . Hypertension   . IFG (impaired fasting glucose)   . Anemia   . Gout   . Diabetes mellitus without complication ([place])   . GERD (gastroesophageal reflux disease)   . Gout   . Anxiety   . Arthritis     knees, shoulders    Past Surgical History  Procedure Laterality Date  . Abdominal hysterectomy      Family History  Problem Relation Age of Onset  . Stroke Mother   . Cancer Brother     prostate   Social History:  reports that she has never smoked. She has never used smokeless tobacco. She reports that she does not drink alcohol or use illicit drugs.  Allergies: No Known Allergies  No prescriptions prior to admission    Results for orders placed or performed during the hospital encounter of 10/05/15 (from the past 48 hour(s))  Basic metabolic panel     Status: Abnormal   Collection Time: 10/01/15 10:20 AM  Result Value Ref Range   Sodium 137 135 - 145 mmol/L   Potassium 4.7 3.5 - 5.1 mmol/L   Chloride 101 101 - 111 mmol/L   CO2 29 22 - 32 mmol/L   Glucose, Bld 102 (H) 65 - 99 mg/dL   BUN 13 6 - 20 mg/dL   Creatinine, Ser 1.13 (H) 0.44 - 1.00 mg/dL   Calcium 9.5 8.9 - 10.3 mg/dL   GFR calc non Af Amer 45 (L) >60 mL/min   GFR calc Af Amer 52 (L) >60 mL/min    Comment: (NOTE) The eGFR has been calculated  using the CKD EPI equation. This calculation has not been validated in all clinical situations. eGFR's persistently <60 mL/min signify possible Chronic Kidney Disease.    Anion gap 7 5 - 15   No results found.  Review of Systems  All other systems reviewed and are negative.   Height _0  (1.575 m), weight 74.844 kg (165 lb). Physical Exam  Constitutional:  WD, WN, NAD HEENT:  NCAT, EOMI Neuro/Psych:  Alert & oriented to person, place, and time; appropriate mood & affect Lymphatic: No generalized UE edema or lymphadenopathy Extremities / MSK:  Both UE are normal with respect to appearance, ranges of motion, joint stability, muscle strength/tone, sensation, & perfusion except as otherwise noted:  Both hands have thenar atrophy, with incomplete opposition demonstrable.  Key pinch: Right 6/left 4.  Positive Tinel sign over the median nerve at the wrist, positive carpal compression testing, altered sensibility to light touch with appropriate ring finger splitting.  Monofilament testing on the right, 6.65 in the thumb, index, and long, 3.61 ring and small.  On the left side 4.56 thumb, 3.61 index, 4.56, long, 4.31 ring, 3.61 small.   Labs / Xrays:  No radiographic studies obtained today.  Assessment: Bilateral carpal tunnel  syndrome--very severe electrodiagnostically  Plan:  I discussed these findings with her and reviewed the course of this disease and the likely progression if no treatment is undertaken.  She remained reluctant to consider surgical intervention, and I tried to convey how small the surgery actually is, that it is done under local anesthesia with sedation, without the need for general anesthesia.  Ultimately she decided she would like to proceed with the right side first and this will be scheduled accordingly to her desire with regard to timing.   The details of the operative procedure were discussed with the patient.  Questions were invited and answered.  In addition to the  goal of the procedure, the risks of the procedure to include but not limited to bleeding; infection; damage to the nerves or blood vessels that could result in bleeding, numbness, weakness, chronic pain, and the need for additional procedures; stiffness; the need for revision surgery; and anesthetic risks, were reviewed.  No specific outcome was guaranteed or implied.  Informed consent was obtained.   Yasser Hepp A. 10/02/2015, 12:49 PM

## 2015-10-05 ENCOUNTER — Encounter (HOSPITAL_BASED_OUTPATIENT_CLINIC_OR_DEPARTMENT_OTHER): Payer: Self-pay | Admitting: *Deleted

## 2015-10-05 ENCOUNTER — Encounter (HOSPITAL_BASED_OUTPATIENT_CLINIC_OR_DEPARTMENT_OTHER)
Admission: RE | Disposition: A | Discharge status: Home / Self Care | Payer: Self-pay | Source: Ambulatory Visit | Attending: Orthopedic Surgery

## 2015-10-05 ENCOUNTER — Ambulatory Visit (HOSPITAL_BASED_OUTPATIENT_CLINIC_OR_DEPARTMENT_OTHER)
Admission: RE | Admit: 2015-10-05 | Discharge: 2015-10-05 | Disposition: A | Discharge status: Home / Self Care | Payer: Medicare Other | Source: Ambulatory Visit | Attending: Orthopedic Surgery | Admitting: Orthopedic Surgery

## 2015-10-05 ENCOUNTER — Ambulatory Visit (HOSPITAL_BASED_OUTPATIENT_CLINIC_OR_DEPARTMENT_OTHER): Payer: Medicare Other | Admitting: Anesthesiology

## 2015-10-05 DIAGNOSIS — G5603 Carpal tunnel syndrome, bilateral upper limbs: Secondary | ICD-10-CM | POA: Diagnosis not present

## 2015-10-05 DIAGNOSIS — E785 Hyperlipidemia, unspecified: Secondary | ICD-10-CM | POA: Insufficient documentation

## 2015-10-05 DIAGNOSIS — I10 Essential (primary) hypertension: Secondary | ICD-10-CM | POA: Insufficient documentation

## 2015-10-05 DIAGNOSIS — K219 Gastro-esophageal reflux disease without esophagitis: Secondary | ICD-10-CM | POA: Insufficient documentation

## 2015-10-05 DIAGNOSIS — E119 Type 2 diabetes mellitus without complications: Secondary | ICD-10-CM | POA: Diagnosis not present

## 2015-10-05 HISTORY — DX: Unspecified osteoarthritis, unspecified site: M19.90

## 2015-10-05 HISTORY — PX: CARPAL TUNNEL RELEASE: SHX101

## 2015-10-05 LAB — GLUCOSE, CAPILLARY
GLUCOSE-CAPILLARY: 120 mg/dL — AB (ref 65–99)
Glucose-Capillary: 106 mg/dL — ABNORMAL HIGH (ref 65–99)

## 2015-10-05 SURGERY — RELEASE, CARPAL TUNNEL, ENDOSCOPIC
Anesthesia: Monitor Anesthesia Care | Site: Hand | Laterality: Right

## 2015-10-05 MED ORDER — CEFAZOLIN SODIUM-DEXTROSE 2-3 GM-% IV SOLR
2.0000 g | INTRAVENOUS | Status: AC
  Administered 2015-10-05: 2 g via INTRAVENOUS

## 2015-10-05 MED ORDER — MIDAZOLAM HCL 2 MG/2ML IJ SOLN: 1.0000 mg | INTRAMUSCULAR | Status: DC | PRN

## 2015-10-05 MED ORDER — PANTOPRAZOLE SODIUM 40 MG PO TBEC: DELAYED_RELEASE_TABLET | ORAL | Status: DC

## 2015-10-05 MED ORDER — LACTATED RINGERS IV SOLN: INTRAVENOUS | Status: DC

## 2015-10-05 MED ORDER — FENTANYL CITRATE (PF) 100 MCG/2ML IJ SOLN
INTRAMUSCULAR | Status: AC
  Filled 2015-10-05: qty 2

## 2015-10-05 MED ORDER — LIDOCAINE HCL 2 % IJ SOLN
INTRAMUSCULAR | Status: DC | PRN
  Administered 2015-10-05: 3 mL
  Administered 2015-10-05: 2 mL

## 2015-10-05 MED ORDER — MEPERIDINE HCL 25 MG/ML IJ SOLN: 6.2500 mg | INTRAMUSCULAR | Status: DC | PRN

## 2015-10-05 MED ORDER — ONDANSETRON HCL 4 MG/2ML IJ SOLN
INTRAMUSCULAR | Status: AC
Start: 2015-10-05 — End: 2015-10-05
  Filled 2015-10-05: qty 2

## 2015-10-05 MED ORDER — PROPOFOL 10 MG/ML IV BOLUS
INTRAVENOUS | Status: DC | PRN
  Administered 2015-10-05: 20 mg via INTRAVENOUS

## 2015-10-05 MED ORDER — BUPIVACAINE-EPINEPHRINE (PF) 0.5% -1:200000 IJ SOLN
INTRAMUSCULAR | Status: DC | PRN
  Administered 2015-10-05: 2 mL
  Administered 2015-10-05: 3 mL

## 2015-10-05 MED ORDER — ATENOLOL 50 MG PO TABS: 50.0000 mg | ORAL_TABLET | Freq: Every day | ORAL | Status: DC

## 2015-10-05 MED ORDER — LACTATED RINGERS IV SOLN
INTRAVENOUS | Status: DC
  Administered 2015-10-05: 10 mL/h via INTRAVENOUS

## 2015-10-05 MED ORDER — GLYCOPYRROLATE 0.2 MG/ML IJ SOLN: 0.2000 mg | Freq: Once | INTRAMUSCULAR | Status: DC | PRN

## 2015-10-05 MED ORDER — PROMETHAZINE HCL 25 MG/ML IJ SOLN: 6.2500 mg | INTRAMUSCULAR | Status: DC | PRN

## 2015-10-05 MED ORDER — HYDROCODONE-ACETAMINOPHEN 5-325 MG PO TABS: 1.0000 | ORAL_TABLET | ORAL | Status: DC | PRN

## 2015-10-05 MED ORDER — ONDANSETRON HCL 4 MG/2ML IJ SOLN
INTRAMUSCULAR | Status: AC
  Filled 2015-10-05: qty 2

## 2015-10-05 MED ORDER — ATORVASTATIN CALCIUM 20 MG PO TABS: 20.0000 mg | ORAL_TABLET | Freq: Every day | ORAL | Status: DC

## 2015-10-05 MED ORDER — ONDANSETRON HCL 4 MG/2ML IJ SOLN
INTRAMUSCULAR | Status: DC | PRN
  Administered 2015-10-05: 4 mg via INTRAVENOUS

## 2015-10-05 MED ORDER — CEFAZOLIN SODIUM-DEXTROSE 2-3 GM-% IV SOLR
INTRAVENOUS | Status: AC
  Filled 2015-10-05: qty 50

## 2015-10-05 MED ORDER — FENTANYL CITRATE (PF) 100 MCG/2ML IJ SOLN
50.0000 ug | INTRAMUSCULAR | Status: DC | PRN
  Administered 2015-10-05: 50 ug via INTRAVENOUS

## 2015-10-05 MED ORDER — LIDOCAINE HCL (CARDIAC) 20 MG/ML IV SOLN
INTRAVENOUS | Status: AC
  Filled 2015-10-05: qty 5

## 2015-10-05 MED ORDER — SCOPOLAMINE 1 MG/3DAYS TD PT72: 1.0000 | MEDICATED_PATCH | Freq: Once | TRANSDERMAL | Status: DC | PRN

## 2015-10-05 MED ORDER — HYDROMORPHONE HCL 1 MG/ML IJ SOLN: 0.2500 mg | INTRAMUSCULAR | Status: DC | PRN

## 2015-10-05 MED ORDER — PROPOFOL 10 MG/ML IV BOLUS
INTRAVENOUS | Status: AC
  Filled 2015-10-05: qty 20

## 2015-10-05 MED ORDER — GLIPIZIDE ER 2.5 MG PO TB24: 2.5000 mg | ORAL_TABLET | Freq: Every day | ORAL | Status: DC

## 2015-10-05 SURGICAL SUPPLY — 40 items
APPLICATOR COTTON TIP 6IN STRL (MISCELLANEOUS) ×3 IMPLANT
BLADE HOOK ENDO STRL (BLADE) ×3 IMPLANT
BLADE SURG 15 STRL LF DISP TIS (BLADE) ×1 IMPLANT
BLADE SURG 15 STRL SS (BLADE) ×2
BLADE TRIANGLE EPF/EGR ENDO (BLADE) ×3 IMPLANT
BNDG COHESIVE 4X5 TAN STRL (GAUZE/BANDAGES/DRESSINGS) ×3 IMPLANT
BNDG ESMARK 4X9 LF (GAUZE/BANDAGES/DRESSINGS) ×3 IMPLANT
BNDG GAUZE ELAST 4 BULKY (GAUZE/BANDAGES/DRESSINGS) ×3 IMPLANT
CHLORAPREP W/TINT 26ML (MISCELLANEOUS) ×3 IMPLANT
CORDS BIPOLAR (ELECTRODE) IMPLANT
COVER BACK TABLE 60X90IN (DRAPES) ×3 IMPLANT
COVER MAYO STAND STRL (DRAPES) ×3 IMPLANT
CUFF TOURNIQUET SINGLE 18IN (TOURNIQUET CUFF) ×3 IMPLANT
DRAIN TLS ROUND 10FR (DRAIN) ×3 IMPLANT
DRAPE EXTREMITY T 121X128X90 (DRAPE) ×3 IMPLANT
DRAPE SURG 17X23 STRL (DRAPES) ×3 IMPLANT
DRSG EMULSION OIL 3X3 NADH (GAUZE/BANDAGES/DRESSINGS) ×3 IMPLANT
GLOVE BIO SURGEON STRL SZ7.5 (GLOVE) ×3 IMPLANT
GLOVE BIOGEL PI IND STRL 7.0 (GLOVE) ×3 IMPLANT
GLOVE BIOGEL PI IND STRL 8 (GLOVE) ×1 IMPLANT
GLOVE BIOGEL PI INDICATOR 7.0 (GLOVE) ×6
GLOVE BIOGEL PI INDICATOR 8 (GLOVE) ×2
GLOVE ECLIPSE 6.5 STRL STRAW (GLOVE) ×3 IMPLANT
GOWN STRL REUS W/ TWL LRG LVL3 (GOWN DISPOSABLE) ×2 IMPLANT
GOWN STRL REUS W/TWL LRG LVL3 (GOWN DISPOSABLE) ×4
GOWN STRL REUS W/TWL XL LVL3 (GOWN DISPOSABLE) ×3 IMPLANT
NEEDLE HYPO 25X1 1.5 SAFETY (NEEDLE) ×3 IMPLANT
NS IRRIG 1000ML POUR BTL (IV SOLUTION) ×3 IMPLANT
PACK BASIN DAY SURGERY FS (CUSTOM PROCEDURE TRAY) ×3 IMPLANT
PADDING CAST ABS 4INX4YD NS (CAST SUPPLIES)
PADDING CAST ABS COTTON 4X4 ST (CAST SUPPLIES) IMPLANT
SPONGE GAUZE 4X4 12PLY STER LF (GAUZE/BANDAGES/DRESSINGS) ×3 IMPLANT
STOCKINETTE 6  STRL (DRAPES) ×2
STOCKINETTE 6 STRL (DRAPES) ×1 IMPLANT
SUT VICRYL RAPIDE 4-0 (SUTURE) ×3 IMPLANT
SYR BULB 3OZ (MISCELLANEOUS) ×3 IMPLANT
SYRINGE 10CC LL (SYRINGE) ×3 IMPLANT
TOWEL OR 17X24 6PK STRL BLUE (TOWEL DISPOSABLE) ×6 IMPLANT
TOWEL OR NON WOVEN STRL DISP B (DISPOSABLE) ×3 IMPLANT
UNDERPAD 30X30 (UNDERPADS AND DIAPERS) IMPLANT

## 2015-10-05 NOTE — Op Note (Signed)
10/05/2015  9:14 AM  PATIENT:  Maureen [name]  81 y.o. female  PRE-OPERATIVE DIAGNOSIS:  RIGHT CARPAL TUNNEL SYNDROME  POST-OPERATIVE DIAGNOSIS:  Same  PROCEDURE:  Procedure(s): RIGHT ENDOSCOPIC CARPAL TUNNEL RELEASE  SURGEON:  Surgeon(s): Mack Hook, MD  PHYSICIAN ASSISTANT: Danielle Rankin, OPA-C  ANESTHESIA:  Local / MAC  SPECIMENS:  None  DRAINS:   TLS x 1  EBL:  less than 50 mL  PREOPERATIVE INDICATIONS:  Maureen [name] is a  81 y.o. female with a diagnosis of RIGHT CARPAL TUNNEL SYNDROME who failed conservative measures and elected for surgical management.    The risks benefits and alternatives were discussed with the patient preoperatively including but not limited to the risks of infection, bleeding, nerve injury, cardiopulmonary complications, the need for revision surgery, among others, and the patient verbalized understanding and consented to proceed.  OPERATIVE IMPLANTS: None  OPERATIVE FINDINGS: Tight carpal tunnel, acceptably decompressed following release  OPERATIVE PROCEDURE:  After receiving prophylactic antibiotics, the patient was escorted to the operative theatre and placed in a supine position.   A surgical "time-out" was performed during which the planned procedure, proposed operative site, and the correct patient identity were compared to the operative consent and agreement confirmed by the circulating nurse according to current facility policy.  Following application of a tourniquet to the operative extremity, the planned incisions were marked and anesthetized with a mixture of lidocaine & bupivicaine containing epinephrine.  The exposed skin was prepped with Chloraprep and draped in the usual sterile fashion.  The limb was exsanguinated with an Esmarch bandage and the tourniquet inflated to approximately higher than systolic BP.   The incisions were made sharply. Subcutaneous tissues were dissected with blunt and spreading dissection. At the  proximal incision, the deep forearm fascia was split in line with the skin incision the distal edge grasped with a hemostat. At the mid palmar incision, the palmar fascia was split in line with the skin incision, revealing the underlying superficial palmar arch which was visualized with loupe assisted magnification. The synovial reflector was then introduced into the proximal incision and passed through the carpal canal, uses to reflect synovium from the deep surface of the transverse carpal ligament. It was removed and replaced with the slotted cannula and blunt obturator, passing from proximal to distal and exiting the distal wound superficial to the superficial palmar arch. The obturator was removed and the camera inserted. Visualization of the ligament was acceptable. The triangle shape blade was then inserted distally, advanced to the midportion of the ligament, and there used to create a perforation in the ligament. This instrument was removed and the hooked nstrument was inserted. It was placed into the perforation in the ligament and withdrawn distally, completing transection of the distal half of the ligament. The camera was then removed and placed into the distal end of the cannula. The hooked instrument was placed into the proximal end and advanced facility to be placed into the apex of the V. which had been formed to the distal hemi-transection of the ligament. It was withdrawn proximally, completing transection of the ligament. The adequacy of the release was judged with the scope and the instruments used as a probe. All of the endoscopic instruments are removed and the adequacy of release was again judged from the proximal incision perspective with direct loupe assisted visualization. In addition, the proximal forearm fascia was split for 2 inches proximal to the proximal incision under direct visualization using a sliding scissor technique. The  tourniquet was released, the wound copiously irrigated. A  TLS drain was placed to lie alongside the nerve exiting its own skin hole distally made with some sharp trocar. The skin was then closed with 4-0 Vicryl Rapide interrupted sutures. A light dressing was applied and the balance of 10 mL of the initial anesthetic mixture was placed down the drain, and the drain was clamped to be unclamped in the recovery room.  DISPOSITION:  The patient will be discharged home today, returning in 10-15 days for re-assessment.

## 2015-10-05 NOTE — Anesthesia Postprocedure Evaluation (Signed)
Anesthesia Post Note  Patient: Maureen [name]  Procedure(s) Performed: Procedure(s) (LRB): RIGHT ENDOSCOPIC CARPAL TUNNEL RELEASE (Right)  Anesthesia type: MAC  Patient location: PACU  Post pain: Pain level controlled  Post assessment: Post-op Vital signs reviewed  Last Vitals: BP 156/66 mmHg  Pulse 56  Temp(Src) 36.9 C (Oral)  Resp 18  Ht  (1.575 m)  Wt 169 lb (76.658 kg)  BMI 30.90 kg/m2  SpO2 100%  Post vital signs: Reviewed  Level of consciousness: awake  Complications: No apparent anesthesia complications

## 2015-10-05 NOTE — Interval H&P Note (Signed)
History and Physical Interval Note:  10/05/2015 9:13 AM  [name]  has presented today for surgery, with the diagnosis of RIGHT CARPAL TUNNEL SYNDROME  The various methods of treatment have been discussed with the patient and family. After consideration of risks, benefits and other options for treatment, the patient has consented to  Procedure(s): RIGHT ENDOSCOPIC CARPAL TUNNEL RELEASE (Right) as a surgical intervention .  The patient's history has been reviewed, patient examined, no change in status, stable for surgery.  I have reviewed the patient's chart and labs.  Questions were answered to the patient's satisfaction.     Kalee Mcclenathan A.

## 2015-10-05 NOTE — Anesthesia Preprocedure Evaluation (Signed)
Anesthesia Evaluation  Patient identified by MRN, date of birth, ID band Patient awake    Reviewed: Allergy & Precautions, NPO status , Patient's Chart, lab work & pertinent test results  Airway Mallampati: II  TM Distance: >3 FB Neck ROM: Full    Dental no notable dental hx.    Pulmonary neg pulmonary ROS,    Pulmonary exam normal breath sounds clear to auscultation       Cardiovascular hypertension, Pt. on medications and Pt. on home beta blockers Normal cardiovascular exam Rhythm:Regular Rate:Normal     Neuro/Psych PSYCHIATRIC DISORDERS Anxiety negative neurological ROS     GI/Hepatic Neg liver ROS, GERD  ,  Endo/Other  diabetes, Poorly Controlled, Type 2  Renal/GU negative Renal ROS     Musculoskeletal  (+) Arthritis ,   Abdominal   Peds  Hematology  (+) anemia ,   Anesthesia Other Findings   Reproductive/Obstetrics negative OB ROS                             Anesthesia Physical Anesthesia Plan  ASA: II  Anesthesia Plan: MAC   Post-op Pain Management:    Induction: Intravenous  Airway Management Planned: Simple Face Mask  Additional Equipment:   Intra-op Plan:   Post-operative Plan:   Informed Consent: I have reviewed the patients History and Physical, chart, labs and discussed the procedure including the risks, benefits and alternatives for the proposed anesthesia with the patient or authorized representative who has indicated his/her understanding and acceptance.   Dental advisory given  Plan Discussed with: CRNA  Anesthesia Plan Comments:         Anesthesia Quick Evaluation

## 2015-10-05 NOTE — Transfer of Care (Signed)
Immediate Anesthesia Transfer of Care Note  Patient: Maureen [name]  Procedure(s) Performed: Procedure(s): RIGHT ENDOSCOPIC CARPAL TUNNEL RELEASE (Right)  Patient Location: PACU  Anesthesia Type:MAC  Level of Consciousness: awake and alert   Airway & Oxygen Therapy: Patient Spontanous Breathing and Patient connected to face mask oxygen  Post-op Assessment: Report given to RN and Post -op Vital signs reviewed and stable  Post vital signs: Reviewed and stable  Last Vitals:  Filed Vitals:   10/05/15 0916  BP: 152/99  Pulse: 67  Temp: 36.4 C  Resp: 18    Complications: No apparent anesthesia complications

## 2015-10-05 NOTE — Discharge Instructions (Signed)
Discharge Instructions ° ° °You have a light dressing on your hand.  °You may begin gentle motion of your fingers and hand immediately, but you should not do any heavy lifting or gripping.  Elevate your hand to reduce pain & swelling of the digits.  Ice over the operative site may be helpful to reduce pain & swelling.  DO NOT USE HEAT. °Pain medicine has been prescribed for you.  °Use your medicine as needed over the first 48 hours, and then you can begin to taper your use. You may use Tylenol in place of your prescribed pain medication, but not IN ADDITION to it. °Leave the dressing in place until the third day after your surgery and then remove it, leaving it open to air.  °After the bandage has been removed you may shower, but do not soak the incision.  °You may drive a car when you are off of prescription pain medications and can safely control your vehicle with both hands. °We will address whether therapy will be required or not when you return to the office. °You may have already made your follow-up appointment when we completed your preop visit.  If not, please call our office today or the next business day to make your return appointment for 10-15 days after surgery. ° ° °Please call [phone] during normal business hours or [phone] after hours for any problems. Including the following: ° °- excessive redness of the incisions °- drainage for more than 4 days °- fever of more than 101.5 F ° °*Please note that pain medications will not be refilled after hours or on weekends. ° ° °TLS Drain Instructions °You have a drain tube in place to help limit the amount of blood that collects under your skin in the early post-operative period.  The amount it drains will vary from person-to-person and is dependent upon many factors.  You will have 1-2 extra drain tubes sent with you from the facility.  You should change the tube according to these instructions below when the tube is about half full. ° °BE SURE TO  SLIDE THE CLAMP TO THE CLOSED POSITION BEFORE CHANGING THE TUBE.   ° °RE-OPEN THE CLAMP ONCE THE GLASS EVACUATION TUBE HAS BEEN CHANGED. ° °24 hours after surgery the amount of drainage will likely be negligible and it will be time to remove the tube and discard it.  Simply remove the tape that secures the flexible plastic drainage tube to the bandage and briskly pull the tube straight out.  You will likely feel a little discomfort during this process, but the tube should slide out as it is not sewn or otherwise secured directly to your body.  It is merely held in place by the bandage itself.  °                           ° °If you are not clear about when your first post-op appointment is scheduled, please call the office on the next business day to inquire about it.  Please also call the office if you have any other questions ([phone]).   ° ° ° °Post Anesthesia Home Care Instructions ° °Activity: °Get plenty of rest for the remainder of the day. A responsible adult should stay with you for 24 hours following the procedure.  °For the next 24 hours, DO NOT: °-Drive a car °-Operate machinery °-Drink alcoholic beverages °-Take any medication unless instructed by your physician °-Make any legal   decisions or sign important papers. ° °Meals: °Start with liquid foods such as gelatin or soup. Progress to regular foods as tolerated. Avoid greasy, spicy, heavy foods. If nausea and/or vomiting occur, drink only clear liquids until the nausea and/or vomiting subsides. Call your physician if vomiting continues. ° °Special Instructions/Symptoms: °Your throat may feel dry or sore from the anesthesia or the breathing tube placed in your throat during surgery. If this causes discomfort, gargle with warm salt water. The discomfort should disappear within 24 hours. ° °If you had a scopolamine patch placed behind your ear for the management of post- operative nausea and/or vomiting: ° °1. The medication in the patch is effective  for 72 hours, after which it should be removed.  Wrap patch in a tissue and discard in the trash. Wash hands thoroughly with soap and water. °2. You may remove the patch earlier than 72 hours if you experience unpleasant side effects which may include dry mouth, dizziness or visual disturbances. °3. Avoid touching the patch. Wash your hands with soap and water after contact with the patch. °  ° °

## 2015-10-06 ENCOUNTER — Encounter (HOSPITAL_BASED_OUTPATIENT_CLINIC_OR_DEPARTMENT_OTHER): Payer: Self-pay | Admitting: Orthopedic Surgery

## 2015-10-07 LAB — POCT HEMOGLOBIN-HEMACUE: Hemoglobin: 13.9 g/dL (ref 12.0–15.0)

## 2016-04-12 ENCOUNTER — Other Ambulatory Visit: Payer: Self-pay

## 2016-04-12 DIAGNOSIS — Z1231 Encounter for screening mammogram for malignant neoplasm of breast: Secondary | ICD-10-CM

## 2016-04-29 ENCOUNTER — Ambulatory Visit
Admission: RE | Admit: 2016-04-29 | Discharge: 2016-04-29 | Disposition: A | Discharge status: Home / Self Care | Payer: Medicare Other | Source: Ambulatory Visit

## 2016-04-29 DIAGNOSIS — Z1231 Encounter for screening mammogram for malignant neoplasm of breast: Secondary | ICD-10-CM

## 2016-05-04 ENCOUNTER — Other Ambulatory Visit: Payer: Self-pay | Admitting: Family Medicine

## 2016-05-04 DIAGNOSIS — R928 Other abnormal and inconclusive findings on diagnostic imaging of breast: Secondary | ICD-10-CM

## 2016-05-12 ENCOUNTER — Other Ambulatory Visit: Payer: Self-pay

## 2016-05-13 ENCOUNTER — Ambulatory Visit
Admission: RE | Admit: 2016-05-13 | Discharge: 2016-05-13 | Disposition: A | Discharge status: Home / Self Care | Payer: Medicare Other | Source: Ambulatory Visit | Attending: Family Medicine | Admitting: Family Medicine

## 2016-05-13 DIAGNOSIS — R928 Other abnormal and inconclusive findings on diagnostic imaging of breast: Secondary | ICD-10-CM

## 2016-10-20 ENCOUNTER — Other Ambulatory Visit: Payer: Self-pay | Admitting: Family Medicine

## 2016-10-20 DIAGNOSIS — N63 Unspecified lump in unspecified breast: Secondary | ICD-10-CM

## 2016-11-03 ENCOUNTER — Ambulatory Visit
Admission: RE | Admit: 2016-11-03 | Discharge: 2016-11-03 | Disposition: A | Discharge status: Home / Self Care | Payer: Medicare Other | Source: Ambulatory Visit | Attending: Family Medicine | Admitting: Family Medicine

## 2016-11-03 DIAGNOSIS — N63 Unspecified lump in unspecified breast: Secondary | ICD-10-CM

## 2017-01-19 DIAGNOSIS — H04123 Dry eye syndrome of bilateral lacrimal glands: Secondary | ICD-10-CM | POA: Diagnosis not present

## 2017-01-19 DIAGNOSIS — Z961 Presence of intraocular lens: Secondary | ICD-10-CM | POA: Diagnosis not present

## 2017-01-19 DIAGNOSIS — E119 Type 2 diabetes mellitus without complications: Secondary | ICD-10-CM | POA: Diagnosis not present

## 2017-01-19 DIAGNOSIS — H40013 Open angle with borderline findings, low risk, bilateral: Secondary | ICD-10-CM | POA: Diagnosis not present

## 2017-02-06 DIAGNOSIS — R5383 Other fatigue: Secondary | ICD-10-CM | POA: Diagnosis not present

## 2017-02-06 DIAGNOSIS — M109 Gout, unspecified: Secondary | ICD-10-CM | POA: Diagnosis not present

## 2017-02-06 DIAGNOSIS — M2550 Pain in unspecified joint: Secondary | ICD-10-CM | POA: Diagnosis not present

## 2017-02-06 DIAGNOSIS — Z0000 Encounter for general adult medical examination without abnormal findings: Secondary | ICD-10-CM | POA: Diagnosis not present

## 2017-02-06 DIAGNOSIS — G8929 Other chronic pain: Secondary | ICD-10-CM | POA: Diagnosis not present

## 2017-02-06 DIAGNOSIS — E119 Type 2 diabetes mellitus without complications: Secondary | ICD-10-CM | POA: Diagnosis not present

## 2017-02-06 DIAGNOSIS — M25562 Pain in left knee: Secondary | ICD-10-CM | POA: Diagnosis not present

## 2017-02-06 DIAGNOSIS — M25561 Pain in right knee: Secondary | ICD-10-CM | POA: Diagnosis not present

## 2017-02-06 DIAGNOSIS — I10 Essential (primary) hypertension: Secondary | ICD-10-CM | POA: Diagnosis not present

## 2017-02-13 DIAGNOSIS — R52 Pain, unspecified: Secondary | ICD-10-CM | POA: Diagnosis not present

## 2017-02-13 DIAGNOSIS — F419 Anxiety disorder, unspecified: Secondary | ICD-10-CM | POA: Diagnosis not present

## 2017-02-13 DIAGNOSIS — M170 Bilateral primary osteoarthritis of knee: Secondary | ICD-10-CM | POA: Diagnosis not present

## 2017-02-13 DIAGNOSIS — Z1211 Encounter for screening for malignant neoplasm of colon: Secondary | ICD-10-CM | POA: Diagnosis not present

## 2017-02-13 DIAGNOSIS — R5381 Other malaise: Secondary | ICD-10-CM | POA: Diagnosis not present

## 2017-02-13 DIAGNOSIS — D649 Anemia, unspecified: Secondary | ICD-10-CM | POA: Diagnosis not present

## 2017-02-24 ENCOUNTER — Emergency Department (HOSPITAL_COMMUNITY): Payer: Medicare Other

## 2017-02-24 ENCOUNTER — Emergency Department (HOSPITAL_COMMUNITY)
Admission: EM | Admit: 2017-02-24 | Discharge: 2017-02-24 | Disposition: A | Discharge status: Home / Self Care | Payer: Medicare Other | Attending: Emergency Medicine | Admitting: Emergency Medicine

## 2017-02-24 ENCOUNTER — Encounter (HOSPITAL_COMMUNITY): Payer: Self-pay | Admitting: Emergency Medicine

## 2017-02-24 DIAGNOSIS — G8929 Other chronic pain: Secondary | ICD-10-CM | POA: Diagnosis not present

## 2017-02-24 DIAGNOSIS — M25561 Pain in right knee: Secondary | ICD-10-CM | POA: Diagnosis not present

## 2017-02-24 DIAGNOSIS — Z7982 Long term (current) use of aspirin: Secondary | ICD-10-CM | POA: Insufficient documentation

## 2017-02-24 DIAGNOSIS — E119 Type 2 diabetes mellitus without complications: Secondary | ICD-10-CM | POA: Diagnosis not present

## 2017-02-24 DIAGNOSIS — M25461 Effusion, right knee: Secondary | ICD-10-CM | POA: Diagnosis not present

## 2017-02-24 DIAGNOSIS — M25562 Pain in left knee: Secondary | ICD-10-CM | POA: Diagnosis not present

## 2017-02-24 DIAGNOSIS — I10 Essential (primary) hypertension: Secondary | ICD-10-CM | POA: Diagnosis not present

## 2017-02-24 DIAGNOSIS — Z79899 Other long term (current) drug therapy: Secondary | ICD-10-CM | POA: Insufficient documentation

## 2017-02-24 DIAGNOSIS — Z7984 Long term (current) use of oral hypoglycemic drugs: Secondary | ICD-10-CM | POA: Insufficient documentation

## 2017-02-24 DIAGNOSIS — M1712 Unilateral primary osteoarthritis, left knee: Secondary | ICD-10-CM | POA: Diagnosis not present

## 2017-02-24 LAB — URINALYSIS, ROUTINE W REFLEX MICROSCOPIC
BILIRUBIN URINE: NEGATIVE
Glucose, UA: NEGATIVE mg/dL
Hgb urine dipstick: NEGATIVE
Ketones, ur: 20 mg/dL — AB
Nitrite: NEGATIVE
PROTEIN: 30 mg/dL — AB
Specific Gravity, Urine: 1.011 (ref 1.005–1.030)
pH: 7 (ref 5.0–8.0)

## 2017-02-24 MED ORDER — TRAMADOL HCL 50 MG PO TABS
50.0000 mg | ORAL_TABLET | Freq: Once | ORAL | Status: AC
  Administered 2017-02-24: 50 mg via ORAL
  Filled 2017-02-24: qty 1

## 2017-02-24 MED ORDER — ACETAMINOPHEN 500 MG PO TABS
1000.0000 mg | ORAL_TABLET | Freq: Once | ORAL | Status: AC
  Administered 2017-02-24: 1000 mg via ORAL
  Filled 2017-02-24: qty 2

## 2017-02-24 NOTE — ED Triage Notes (Signed)
Pt complaint of bilateral knee pain for two months with no relief from tramadol; denies injury.

## 2017-02-24 NOTE — ED Provider Notes (Signed)
[facility]-EMERGENCY DEPT Provider Note   CSN: [id number] Arrival date & time: 02/24/17  1412     History   Chief Complaint Chief Complaint  Patient presents with  . Knee Pain    HPI Maureen [name] is a 83 y.o. female with pertinent past medical history of bilateral knee osteoarthritis and chronic pain in her knees, generalized pain, anemia, anxiety, type 2 diabetes mellitus on Januvia, glipizide and metformin, hypertension, gout person's to the emergency department with bilateral, severe, intermittent knee pain that she has been experiencing for a few years. Patient states that the pain has worsened over the last couple of weeks. Patient states that the pain is so severe that it causes her to "shake all over". Patient also describes that her bilateral knee pain can sometimes go up to her back. Aggravating factors include ambulation, weightbearing, palpation. Alleviating factors include rest, Celebrex and avoiding putting weight on her knees. Patient's daughter states that PCP has  prescribed Celebrex, Voltaren gel, Tylenol and tramadol for her generalized pains. She was last seen by PCP on 02/13/2017. At that visit patient was referred to orthopedist for chronic bilateral knee pain and hand issues. He shouldn't was also started on Cymbalta. Patient has appointment with orthopedist in 4 days. Patient's daughter tells me that patient does not like to take pain medications and oftentimes waits until her pain is severe. Patient lives with her 1 son who is reliably helping her around the house and with her medications, however patient's daughter states that she wouldn't be surprised that patient has not been taking her pain medication as prescribed. Patient's daughter tells me that patient has not been walking and ambulating around her house as she normally does the last 2-3 days.  Patient denies fevers, warmth, swelling, redness to her knees. No associated urinary symptoms, abdominal pain, n/v/d/c,  numbness/weakness/tingling to her lower extremities. Patient also tells me that she has left wrist pain and left shoulder pain that has also been there for quite some time. Patient denies recent falls, injuries.   HPI  Past Medical History:  Diagnosis Date  . Anemia   . Anxiety   . Arthritis    knees, shoulders  . Diabetes mellitus without complication (HCC)   . GERD (gastroesophageal reflux disease)   . Gout   . Gout   . Hyperlipidemia   . Hypertension   . IFG (impaired fasting glucose)     Patient Active Problem List   Diagnosis Date Noted  . Osteoarthritis of both knees 06/15/2014  . Type II or unspecified type diabetes mellitus without mention of complication, not stated as uncontrolled 06/15/2014  . Diaphoresis 11/02/2013  . Chest tightness 11/02/2013  . Anxiety state, unspecified 11/02/2013  . Osteoarthritis 06/03/2013  . Impaired fasting glucose 04/28/2013  . Essential hypertension, benign 04/28/2013  . Other malaise and fatigue 04/28/2013  . Encounter for therapeutic drug monitoring 04/28/2013  . Unspecified vitamin D deficiency 04/28/2013  . Other and unspecified hyperlipidemia 04/11/2013  . Unspecified essential hypertension 04/11/2013  . Gout 04/11/2013    Past Surgical History:  Procedure Laterality Date  . ABDOMINAL HYSTERECTOMY    . CARPAL TUNNEL RELEASE Right 10/05/2015   Procedure: RIGHT ENDOSCOPIC CARPAL TUNNEL RELEASE;  Surgeon: Mack Hook, MD;  Location: [facility];  Service: Orthopedics;  Laterality: Right;    OB History    No data available       Home Medications    Prior to Admission medications   Medication Sig Start Date End  Date Taking? Authorizing Provider  amLODipine (NORVASC) 2.5 MG tablet Take 2.5 mg by mouth daily.   Yes Historical Provider, MD  aspirin EC 81 MG tablet Take 81 mg by mouth daily.   Yes Historical Provider, MD  atorvastatin (LIPITOR) 20 MG tablet Take 1 tablet (20 mg total) by mouth daily. Patient  taking differently: Take 20 mg by mouth at bedtime.  10/05/15 03/31/17 Yes Mack Hook, MD  celecoxib (CELEBREX) 200 MG capsule Take 200 mg by mouth daily.   Yes Historical Provider, MD  diclofenac sodium (VOLTAREN) 1 % GEL Apply 4 g topically 4 (four) times daily as needed (for pain).   Yes Historical Provider, MD  DULoxetine (CYMBALTA) 30 MG capsule Take 30 mg by mouth daily.   Yes Historical Provider, MD  ergocalciferol (VITAMIN D2) 50000 units capsule Take 50,000 Units by mouth every Sunday.   Yes Historical Provider, MD  glipiZIDE (GLUCOTROL XL) 5 MG 24 hr tablet Take 5 mg by mouth daily with breakfast.   Yes Historical Provider, MD  metFORMIN (GLUCOPHAGE-XR) 500 MG 24 hr tablet Take 500 mg by mouth at bedtime.   Yes Historical Provider, MD  metoprolol succinate (TOPROL-XL) 50 MG 24 hr tablet Take 50 mg by mouth at bedtime. Take with or immediately following a meal.   Yes Historical Provider, MD  pantoprazole (PROTONIX) 40 MG tablet Take 40 mg by mouth daily.   Yes Historical Provider, MD  sitaGLIPtin (JANUVIA) 100 MG tablet Take 100 mg by mouth daily.   Yes Historical Provider, MD  telmisartan-hydrochlorothiazide (MICARDIS HCT) 80-25 MG tablet Take 1 tablet by mouth daily.   Yes Historical Provider, MD  traMADol (ULTRAM) 50 MG tablet Take 50 mg by mouth 2 (two) times daily as needed for moderate pain.   Yes Historical Provider, MD    Family History Family History  Problem Relation Age of Onset  . Stroke Mother   . Cancer Brother     prostate    Social History Social History  Substance Use Topics  . Smoking status: Never Smoker  . Smokeless tobacco: Never Used  . Alcohol use No     Allergies   Patient has no known allergies.   Review of Systems Review of Systems  Constitutional: Negative for appetite change, chills and fever.  HENT: Negative for congestion.   Eyes: Negative for visual disturbance.  Respiratory: Negative for cough, choking, chest tightness and shortness  of breath.   Cardiovascular: Negative for chest pain, palpitations and leg swelling.  Gastrointestinal: Negative for abdominal pain, blood in stool, constipation, diarrhea, nausea and vomiting.  Genitourinary: Negative for difficulty urinating, dysuria, hematuria and pelvic pain.  Musculoskeletal: Positive for arthralgias and gait problem.  Skin: Negative for rash and wound.  Neurological: Negative for dizziness, syncope, weakness, light-headedness, numbness and headaches.  Hematological: Does not bruise/bleed easily.  Psychiatric/Behavioral: Negative.      Physical Exam Updated Vital Signs BP 161/83   Pulse 97   Temp 98.1 F (36.7 C) (Oral)   Resp 16   SpO2 98%   Physical Exam  Constitutional: She is oriented to person, place, and time. She appears well-developed and well-nourished.  HENT:  Head: Normocephalic and atraumatic.  Nose: Nose normal.  Mouth/Throat: Oropharynx is clear and moist. No oropharyngeal exudate.  Moist mucous membranes.  Eyes: Conjunctivae and EOM are normal. Pupils are equal, round, and reactive to light. No scleral icterus.  No conjunctival pallor  Neck: Normal range of motion. Neck supple. No JVD present.  Cardiovascular: Normal  rate, regular rhythm, normal heart sounds and intact distal pulses.   No murmur heard. Hypertensive SBP 161, did not take BP meds this morning. Popliteal and DP pulses intact bilaterally  Pulmonary/Chest: Effort normal and breath sounds normal. No respiratory distress. She has no wheezes. She has no rales. She exhibits no tenderness.  Abdominal: Soft. Bowel sounds are normal. She exhibits no distension and no mass. There is no tenderness. There is no rebound and no guarding.  Musculoskeletal: Normal range of motion. She exhibits edema and tenderness. She exhibits no deformity.  Slight crepitus with knee flexion and extension, bilaterally.  Slight diffuse edema over medial aspect of left knee.  Tenderness in medial and lateral  joint lines, bilaterally.  Positive valgus test bilaterally.   No erythema or warmth in kness.  Full passive ROM of knees bilaterally with normal patellar J tracking bilaterally.  No bony tenderness over patella, fibular head or tibial tuberosity.   No tenderness over MCL, LCL, patellar tendon or quadriceps tendon.    Negative Lachman's. Negative posterior drawer test.  Negative McMurray's. Negative ballottement test.  Patient able to bear weight in ED (4+ steps)  Lymphadenopathy:    She has no cervical adenopathy.  Neurological: She is alert and oriented to person, place, and time. No sensory deficit.  Gait normal. No foot drop.  5/5 strength with hip flexion and extension, bilaterally.  5/5 strength with knee flexion and extension, bilaterally.  5/5 strength with ankle dorsiflexion and plantar flexion, bilaterally.  Sensation to light touch intact in the distribution of the obturator nerve, lateral cutaneous nerve, femoral nerve, common fibular nerve.  2/4 knee and ankle DTR bilaterally.    Foot: sensation to light touch intact in the distribution of the saphenous nerve, medial plantar nerve, lateral plantar nerve, bilaterally.   Skin: Skin is warm and dry. Capillary refill takes less than 2 seconds.  Psychiatric: She has a normal mood and affect. Her behavior is normal. Judgment and thought content normal.  Nursing note and vitals reviewed.    ED Treatments / Results  Labs (all labs ordered are listed, but only abnormal results are displayed) Labs Reviewed  URINALYSIS, ROUTINE W REFLEX MICROSCOPIC - Abnormal; Notable for the following:       Result Value   Ketones, ur 20 (*)    Protein, ur 30 (*)    Leukocytes, UA SMALL (*)    Bacteria, UA RARE (*)    Squamous Epithelial / LPF 0-5 (*)    All other components within normal limits    EKG  EKG Interpretation None       Radiology Dg Knee Complete 4 Views Left  Result Date: 02/24/2017 CLINICAL DATA:  Initial evaluation  for worsening chronic joint pain for 3 days. No injury. EXAM: LEFT KNEE - COMPLETE 4+ VIEW COMPARISON:  Prior radiograph from 12/03/2013. FINDINGS: No acute fracture or dislocation. No significant joint effusion. Moderate to advanced tricompartmental degenerative osteoarthrosis, greatest within the medial femoral tibial joint space compartment, advanced from prior. Osseous mineralization normal. No acute soft tissue abnormality. Vascular calcifications noted within the 5. IMPRESSION: 1. No acute osseous abnormality about the knee. 2. Moderate to advanced tricompartmental degenerative osteoarthrosis, greatest within the medial femorotibial joint space compartment. Changes are advanced relative to 2014. Electronically Signed   By: Rise Mu M.D.   On: 02/24/2017 18:38   Dg Knee Complete 4 Views Right  Result Date: 02/24/2017 CLINICAL DATA:  83 y/o  F; worsening chronic joint pain over 3 days. EXAM:  RIGHT KNEE - COMPLETE 4+ VIEW COMPARISON:  12/03/2013 right knee radiographs. FINDINGS: Stable osteoarthrosis of the knee with moderate to severe medial femorotibial compartment and mild lateral compartment joint space narrowing. Moderate-sized tricompartmental osteophytes and spurring of tibial spines. Small suprapatellar joint effusion. No acute fracture or dislocation. Vascular calcifications. IMPRESSION: Stable moderate to severe tricompartmental osteoarthrosis greatest in medial femorotibial compartment. Small joint effusion. No acute osseous abnormality. Electronically Signed   By: Mitzi HansenLance  Furusawa-Stratton M.D.   On: 02/24/2017 18:38    Procedures Procedures (including critical care time)  Medications Ordered in ED Medications  acetaminophen (TYLENOL) tablet 1,000 mg (1,000 mg Oral Given 02/24/17 1840)  traMADol (ULTRAM) tablet 50 mg (50 mg Oral Given 02/24/17 1841)     Initial Impression / Assessment and Plan / ED Course  I have reviewed the triage vital signs and the nursing  notes.  Pertinent labs & imaging results that were available during my care of the patient were reviewed by me and considered in my medical decision making (see chart for details).  Clinical Course as of Feb 25 1106  Fri Feb 24, 2017  1921 IMPRESSION: 1. No acute osseous abnormality about the knee. 2. Moderate to advanced tricompartmental degenerative osteoarthrosis, greatest within the medial femorotibial joint space compartment. Changes are advanced relative to 2014. DG Knee Complete 4 Views Left [CG]  1921 IMPRESSION: Stable moderate to severe tricompartmental osteoarthrosis greatest in medial femorotibial compartment. Small joint effusion. No acute osseous abnormality. DG Knee Complete 4 Views Right [CG]  1922 Pulse Rate: 94 [CG]  1922 Temp: 98.1 F (36.7 C) [CG]  1922 BP: 149/72 [CG]  1922 Resp: 16 [CG]  1922 Re-evaluated patient, states her pain has gotten much better since tylenol and tramadol.  Will check urine as patient described vague pain radiating from knee to hips SpO2: 100 % [CG]  2023 Leukocytes, UA: (!) SMALL [CG]  2024 WBC, UA: 6-30 [CG]    Clinical Course User Index [CG] Liberty Handylaudia J Delorse Shane, PA-C   83 year old female with pertinent past medical history of chronic bilateral knee pain secondary to arthritis and generalized pain most significant at left wrist, knees and shoulders presents to the emergency department with worsening bilateral knee pain over the last couple of weeks. Patient's daughter states that patient has been walking and ambulating lasts around her house secondary to worsening knee pain. No recent falls or injuries. Patient was last seen by PCP 2 weeks ago, at that time PCP addressed her generalized pain and bilateral knee pains and started her on Cymbalta, Celebrex, Tylenol and tramadol and referred her to an orthopedist. Patient has a upcoming appointment with orthopedist in 4 days. On exam vital signs are reassuring, patient is hypertensive however she did  not take her blood pressure medicines morning. Patient states that she did not take any of her prescribed pain medicines today. Per patient's daughter patient does not like to take pain medicines, often times waits until she is in pain to take her medications. On exam there is crepitus with bilateral knee range of motion, diffuse tenderness over both knees most significant at medial joint lines. No bony tenderness over patella or tibial tuberosity. No neurological deficits in lower extremities. I personally ambulated the patient to was able to take 4+ slow and steady steps with me, she reported weightbearing worsened bilateral knee pain. Popliteal and DP pulses are intact and symmetric bilaterally. Last x-rays were 3 years ago, x-rays today revealed worsening osteoarthritis in the left knee and stable, unchanged just  arthritis to the right knee. I did check a urine analysis as patient reported vague radiation from bilateral knees up to her lower back. No signs of infection urinalysis. Patient was given her prescribed pain regimen, I reassessed her and she states that her pain has improved. I do not think further emergent lab work or imaging is indicated at this time. Patient does not live alone, has her son who helps her with her medications and ambulating around the house. Patient ordered has no coming orthopedist appointment. Patient will be discharged at this time, encouraged follow-up with orthopedist appointment in 4 days. Strict ED return precautions given. I told patient and her daughter that tramadol may cause some drowsiness, they know to monitor her and be cautious with this medicine.  Final Clinical Impressions(s) / ED Diagnoses   Final diagnoses:  Chronic pain of both knees    New Prescriptions Discharge Medication List as of 02/24/2017  8:50 PM       Liberty Handy, PA-C 02/25/17 1107    Doug Sou, MD 02/27/17 1358

## 2017-02-24 NOTE — ED Notes (Signed)
Pt's daughter reports bila knee pain and L shoulder pain, states this has been going on for a while now.  Slight swelling noted.  Denies injury

## 2017-02-24 NOTE — Discharge Instructions (Signed)
LEFT KNEE X-RAY IMPRESSION:  1. No acute osseous abnormality about the knee.  2. Moderate to advanced tricompartmental degenerative  osteoarthrosis, greatest within the medial femorotibial joint space  compartment. Changes are advanced relative to 2014.   RIGHT KNEE X-RAY IMPRESSION:  Stable moderate to severe tricompartmental osteoarthrosis greatest  in medial femorotibial compartment. Small joint effusion. No acute  osseous abnormality.   Your left knee x-ray showed worsening osteoarthritis, your right knee x-ray showed stable osteoarthritis. Please make sure that you take your pain medications as prescribed to decrease inflammation and better control your pain. Please follow-up with the orthopedist appointment on 03/02/2017 as we discussed.  You urine did not show an infection.

## 2017-03-02 DIAGNOSIS — G8929 Other chronic pain: Secondary | ICD-10-CM | POA: Diagnosis not present

## 2017-03-02 DIAGNOSIS — M25561 Pain in right knee: Secondary | ICD-10-CM | POA: Diagnosis not present

## 2017-03-02 DIAGNOSIS — M25562 Pain in left knee: Secondary | ICD-10-CM | POA: Diagnosis not present

## 2017-03-09 DIAGNOSIS — M25561 Pain in right knee: Secondary | ICD-10-CM | POA: Diagnosis not present

## 2017-03-09 DIAGNOSIS — G8929 Other chronic pain: Secondary | ICD-10-CM | POA: Diagnosis not present

## 2017-03-09 DIAGNOSIS — R269 Unspecified abnormalities of gait and mobility: Secondary | ICD-10-CM | POA: Diagnosis not present

## 2017-03-09 DIAGNOSIS — M25562 Pain in left knee: Secondary | ICD-10-CM | POA: Diagnosis not present

## 2017-03-13 DIAGNOSIS — R634 Abnormal weight loss: Secondary | ICD-10-CM | POA: Diagnosis not present

## 2017-03-13 DIAGNOSIS — E119 Type 2 diabetes mellitus without complications: Secondary | ICD-10-CM | POA: Diagnosis not present

## 2017-03-13 DIAGNOSIS — D649 Anemia, unspecified: Secondary | ICD-10-CM | POA: Diagnosis not present

## 2017-03-13 DIAGNOSIS — B350 Tinea barbae and tinea capitis: Secondary | ICD-10-CM | POA: Diagnosis not present

## 2017-03-13 DIAGNOSIS — R251 Tremor, unspecified: Secondary | ICD-10-CM | POA: Diagnosis not present

## 2017-03-13 DIAGNOSIS — I10 Essential (primary) hypertension: Secondary | ICD-10-CM | POA: Diagnosis not present

## 2017-03-13 DIAGNOSIS — R946 Abnormal results of thyroid function studies: Secondary | ICD-10-CM | POA: Diagnosis not present

## 2017-03-13 DIAGNOSIS — F419 Anxiety disorder, unspecified: Secondary | ICD-10-CM | POA: Diagnosis not present

## 2017-03-23 DIAGNOSIS — G5601 Carpal tunnel syndrome, right upper limb: Secondary | ICD-10-CM | POA: Diagnosis not present

## 2017-03-23 DIAGNOSIS — G5602 Carpal tunnel syndrome, left upper limb: Secondary | ICD-10-CM | POA: Diagnosis not present

## 2017-03-28 DIAGNOSIS — E785 Hyperlipidemia, unspecified: Secondary | ICD-10-CM | POA: Diagnosis not present

## 2017-03-28 DIAGNOSIS — R251 Tremor, unspecified: Secondary | ICD-10-CM | POA: Diagnosis not present

## 2017-03-28 DIAGNOSIS — I10 Essential (primary) hypertension: Secondary | ICD-10-CM | POA: Diagnosis not present

## 2017-03-28 DIAGNOSIS — F419 Anxiety disorder, unspecified: Secondary | ICD-10-CM | POA: Diagnosis not present

## 2017-03-28 DIAGNOSIS — E1165 Type 2 diabetes mellitus with hyperglycemia: Secondary | ICD-10-CM | POA: Diagnosis not present

## 2017-03-31 DIAGNOSIS — G5602 Carpal tunnel syndrome, left upper limb: Secondary | ICD-10-CM | POA: Diagnosis not present

## 2017-04-26 DIAGNOSIS — M75100 Unspecified rotator cuff tear or rupture of unspecified shoulder, not specified as traumatic: Secondary | ICD-10-CM | POA: Diagnosis not present

## 2017-04-26 DIAGNOSIS — M25512 Pain in left shoulder: Secondary | ICD-10-CM | POA: Diagnosis not present

## 2017-04-26 DIAGNOSIS — M25511 Pain in right shoulder: Secondary | ICD-10-CM | POA: Diagnosis not present

## 2017-05-10 DIAGNOSIS — R946 Abnormal results of thyroid function studies: Secondary | ICD-10-CM | POA: Diagnosis not present

## 2017-05-10 DIAGNOSIS — D649 Anemia, unspecified: Secondary | ICD-10-CM | POA: Diagnosis not present

## 2017-05-10 DIAGNOSIS — F419 Anxiety disorder, unspecified: Secondary | ICD-10-CM | POA: Diagnosis not present

## 2017-06-06 DIAGNOSIS — R293 Abnormal posture: Secondary | ICD-10-CM | POA: Diagnosis not present

## 2017-06-06 DIAGNOSIS — M25511 Pain in right shoulder: Secondary | ICD-10-CM | POA: Diagnosis not present

## 2017-06-06 DIAGNOSIS — M25512 Pain in left shoulder: Secondary | ICD-10-CM | POA: Diagnosis not present

## 2017-06-06 DIAGNOSIS — M6281 Muscle weakness (generalized): Secondary | ICD-10-CM | POA: Diagnosis not present

## 2017-06-09 DIAGNOSIS — R293 Abnormal posture: Secondary | ICD-10-CM | POA: Diagnosis not present

## 2017-06-09 DIAGNOSIS — M6281 Muscle weakness (generalized): Secondary | ICD-10-CM | POA: Diagnosis not present

## 2017-06-09 DIAGNOSIS — M25511 Pain in right shoulder: Secondary | ICD-10-CM | POA: Diagnosis not present

## 2017-06-09 DIAGNOSIS — M25512 Pain in left shoulder: Secondary | ICD-10-CM | POA: Diagnosis not present

## 2017-06-13 DIAGNOSIS — R293 Abnormal posture: Secondary | ICD-10-CM | POA: Diagnosis not present

## 2017-06-13 DIAGNOSIS — M25512 Pain in left shoulder: Secondary | ICD-10-CM | POA: Diagnosis not present

## 2017-06-13 DIAGNOSIS — M25511 Pain in right shoulder: Secondary | ICD-10-CM | POA: Diagnosis not present

## 2017-06-13 DIAGNOSIS — M6281 Muscle weakness (generalized): Secondary | ICD-10-CM | POA: Diagnosis not present

## 2017-06-15 DIAGNOSIS — M25512 Pain in left shoulder: Secondary | ICD-10-CM | POA: Diagnosis not present

## 2017-06-15 DIAGNOSIS — R293 Abnormal posture: Secondary | ICD-10-CM | POA: Diagnosis not present

## 2017-06-15 DIAGNOSIS — M6281 Muscle weakness (generalized): Secondary | ICD-10-CM | POA: Diagnosis not present

## 2017-06-15 DIAGNOSIS — M25511 Pain in right shoulder: Secondary | ICD-10-CM | POA: Diagnosis not present

## 2017-06-21 DIAGNOSIS — D649 Anemia, unspecified: Secondary | ICD-10-CM | POA: Diagnosis not present

## 2017-06-21 DIAGNOSIS — Z23 Encounter for immunization: Secondary | ICD-10-CM | POA: Diagnosis not present

## 2017-07-12 ENCOUNTER — Other Ambulatory Visit: Payer: Self-pay | Admitting: Family

## 2017-07-12 DIAGNOSIS — D509 Iron deficiency anemia, unspecified: Secondary | ICD-10-CM

## 2017-07-13 ENCOUNTER — Ambulatory Visit: Payer: Medicare Other

## 2017-07-13 ENCOUNTER — Other Ambulatory Visit (HOSPITAL_BASED_OUTPATIENT_CLINIC_OR_DEPARTMENT_OTHER): Payer: Medicare Other

## 2017-07-13 ENCOUNTER — Ambulatory Visit (HOSPITAL_BASED_OUTPATIENT_CLINIC_OR_DEPARTMENT_OTHER): Payer: Medicare Other | Admitting: Family

## 2017-07-13 VITALS — BP 148/70 | HR 84 | Temp 97.9°F | Resp 18 | Wt 133.0 lb

## 2017-07-13 DIAGNOSIS — I10 Essential (primary) hypertension: Secondary | ICD-10-CM

## 2017-07-13 DIAGNOSIS — E119 Type 2 diabetes mellitus without complications: Secondary | ICD-10-CM

## 2017-07-13 DIAGNOSIS — Z8042 Family history of malignant neoplasm of prostate: Secondary | ICD-10-CM | POA: Diagnosis not present

## 2017-07-13 DIAGNOSIS — D649 Anemia, unspecified: Secondary | ICD-10-CM | POA: Diagnosis not present

## 2017-07-13 DIAGNOSIS — D509 Iron deficiency anemia, unspecified: Secondary | ICD-10-CM

## 2017-07-13 LAB — CBC WITH DIFFERENTIAL (CANCER CENTER ONLY)
BASO#: 0 10*3/uL (ref 0.0–0.2)
BASO%: 0 % (ref 0.0–2.0)
EOS ABS: 0.1 10*3/uL (ref 0.0–0.5)
EOS%: 1.8 % (ref 0.0–7.0)
HEMATOCRIT: 32 % — AB (ref 34.8–46.6)
HGB: 10.3 g/dL — ABNORMAL LOW (ref 11.6–15.9)
LYMPH#: 0.5 10*3/uL — AB (ref 0.9–3.3)
LYMPH%: 15.6 % (ref 14.0–48.0)
MCH: 24.7 pg — AB (ref 26.0–34.0)
MCHC: 32.2 g/dL (ref 32.0–36.0)
MCV: 77 fL — AB (ref 81–101)
MONO#: 0.5 10*3/uL (ref 0.1–0.9)
MONO%: 15.3 % — ABNORMAL HIGH (ref 0.0–13.0)
NEUT#: 2.3 10*3/uL (ref 1.5–6.5)
NEUT%: 67.3 % (ref 39.6–80.0)
Platelets: 159 10*3/uL (ref 145–400)
RBC: 4.17 10*6/uL (ref 3.70–5.32)
RDW: 18.3 % — AB (ref 11.1–15.7)
WBC: 3.4 10*3/uL — ABNORMAL LOW (ref 3.9–10.0)

## 2017-07-13 LAB — CMP (CANCER CENTER ONLY)
ALBUMIN: 3.3 g/dL (ref 3.3–5.5)
ALT(SGPT): 19 U/L (ref 10–47)
AST: 27 U/L (ref 11–38)
Alkaline Phosphatase: 67 U/L (ref 26–84)
BUN, Bld: 13 mg/dL (ref 7–22)
CALCIUM: 9.5 mg/dL (ref 8.0–10.3)
CHLORIDE: 97 meq/L — AB (ref 98–108)
CO2: 30 mEq/L (ref 18–33)
CREATININE: 1 mg/dL (ref 0.6–1.2)
Glucose, Bld: 104 mg/dL (ref 73–118)
Potassium: 4.2 mEq/L (ref 3.3–4.7)
SODIUM: 134 meq/L (ref 128–145)
TOTAL PROTEIN: 7.1 g/dL (ref 6.4–8.1)
Total Bilirubin: 0.6 mg/dl (ref 0.20–1.60)

## 2017-07-13 LAB — TECHNOLOGIST REVIEW CHCC SATELLITE

## 2017-07-13 LAB — CHCC SATELLITE - SMEAR

## 2017-07-13 LAB — LACTATE DEHYDROGENASE: LDH: 190 U/L (ref 125–245)

## 2017-07-13 NOTE — Progress Notes (Signed)
Hematology/Oncology Consultation   Name: Maureen [name]      MRN: [id number]    Location: Room/bed info not found  Date: 07/13/2017 Time:1:53 PM   REFERRING PHYSICIAN: Adela Lankobyn Kim Zanard, MD  REASON FOR CONSULT: Anemia    DIAGNOSIS:   HISTORY OF PRESENT ILLNESS: Maureen [name] is a very pleasant 83 yo [place] female with a recent history of anemia over the last few months. Hgb is 10.3 with an MCV of 77. She is symptomatic at this time with fatigue, chills, weakness, "swishing" sound in ears, SOB with over exertion and swelling in ankles.  She has not been able to tolerate oral iron due to GI upset and worsening of constipation.  Her daughter was also anemic but resolved once she went through the change of life.  No sickle cell disease or trait. No personal history of cancer.  Family history of cancer includes both father and brother with prostate cancer.  She takes one enteric coated baby aspirin daily. She states that she had the cologuard recently which was negative.  She denies having any episodes of bleeding. No bruising or petechiae.  No lymphadenopathy found on exam.  No fever, n/v, cough, rash, dizziness, chest pain, palpitations, abdominal pain or changes in bowel or bladder habits.  She has occasional constipation and takes a stool softener as needed.  She has 7 children and history of 1 miscarriage early on. She has had a total hysterectomy.  No tenderness in her extremities. She has some numbness and tingling in her left wrist and recently had a second carpal tunnel surgery recently. No c/o pain.  Her appetite comes and goes and she is trying to stay well hydrated. Her weight is stable.  She states that her blood sugars are stable. Her Hgb A1c in February was 5.8.  She was never a smoker and does not drink alcoholic beverages.   ROS: All other 10 point review of systems is negative.   PAST MEDICAL HISTORY:   Past Medical History:  Diagnosis Date  . Anemia   . Anxiety    . Arthritis    knees, shoulders  . Diabetes mellitus without complication (HCC)   . GERD (gastroesophageal reflux disease)   . Gout   . Gout   . Hyperlipidemia   . Hypertension   . IFG (impaired fasting glucose)     ALLERGIES: No Known Allergies    MEDICATIONS:  Current Outpatient Prescriptions on File Prior to Visit  Medication Sig Dispense Refill  . amLODipine (NORVASC) 2.5 MG tablet Take 2.5 mg by mouth daily.    Marland Kitchen. aspirin EC 81 MG tablet Take 81 mg by mouth daily.    Marland Kitchen. atorvastatin (LIPITOR) 20 MG tablet Take 1 tablet (20 mg total) by mouth daily. (Patient taking differently: Take 20 mg by mouth at bedtime. ) 90 tablet 3  . celecoxib (CELEBREX) 200 MG capsule Take 200 mg by mouth daily.    . diclofenac sodium (VOLTAREN) 1 % GEL Apply 4 g topically 4 (four) times daily as needed (for pain).    . DULoxetine (CYMBALTA) 30 MG capsule Take 30 mg by mouth daily.    . ergocalciferol (VITAMIN D2) 50000 units capsule Take 50,000 Units by mouth every Sunday.    Marland Kitchen. glipiZIDE (GLUCOTROL XL) 5 MG 24 hr tablet Take 5 mg by mouth daily with breakfast.    . metFORMIN (GLUCOPHAGE-XR) 500 MG 24 hr tablet Take 500 mg by mouth at bedtime.    . metoprolol succinate (TOPROL-XL)  50 MG 24 hr tablet Take 50 mg by mouth at bedtime. Take with or immediately following a meal.    . pantoprazole (PROTONIX) 40 MG tablet Take 40 mg by mouth daily.    . sitaGLIPtin (JANUVIA) 100 MG tablet Take 100 mg by mouth daily.    Marland Kitchen telmisartan-hydrochlorothiazide (MICARDIS HCT) 80-25 MG tablet Take 1 tablet by mouth daily.    . traMADol (ULTRAM) 50 MG tablet Take 50 mg by mouth 2 (two) times daily as needed for moderate pain.     No current facility-administered medications on file prior to visit.      PAST SURGICAL HISTORY Past Surgical History:  Procedure Laterality Date  . ABDOMINAL HYSTERECTOMY    . CARPAL TUNNEL RELEASE Right 10/05/2015   Procedure: RIGHT ENDOSCOPIC CARPAL TUNNEL RELEASE;  Surgeon: Mack Hook, MD;  Location: [facility];  Service: Orthopedics;  Laterality: Right;    FAMILY HISTORY: Family History  Problem Relation Age of Onset  . Stroke Mother   . Cancer Brother        prostate    SOCIAL HISTORY:  reports that she has never smoked. She has never used smokeless tobacco. She reports that she does not drink alcohol or use drugs.  PERFORMANCE STATUS: The patient's performance status is 1 - Symptomatic but completely ambulatory  PHYSICAL EXAM: Most Recent Vital Signs: There were no vitals taken for this visit. BP (!) 148/70 (BP Location: Right Arm, Patient Position: Sitting)   Pulse 84   Temp 97.9 F (36.6 C) (Oral)   Resp 18   Wt 133 lb (60.3 kg)   SpO2 100%   BMI 24.33 kg/m   General Appearance:    Alert, cooperative, no distress, appears stated age  Head:    Normocephalic, without obvious abnormality, atraumatic  Eyes:    PERRL, conjunctiva/corneas clear, EOM's intact, fundi    benign, both eyes        Throat:   Lips, mucosa, and tongue normal; teeth and gums normal  Neck:   Supple, symmetrical, trachea midline, no adenopathy;    thyroid:  no enlargement/tenderness/nodules; no carotid   bruit or JVD  Back:     Symmetric, no curvature, ROM normal, no CVA tenderness  Lungs:     Clear to auscultation bilaterally, respirations unlabored  Chest Wall:    No tenderness or deformity   Heart:    Regular rate and rhythm, S1 and S2 normal, no murmur, rub   or gallop     Abdomen:     Soft, non-tender, bowel sounds active all four quadrants,    no masses, no organomegaly        Extremities:   Extremities normal, atraumatic, no cyanosis or edema  Pulses:   2+ and symmetric all extremities  Skin:   Skin color, texture, turgor normal, no rashes or lesions  Lymph nodes:   Cervical, supraclavicular, and axillary nodes normal  Neurologic:   CNII-XII intact, normal strength, sensation and reflexes    throughout    LABORATORY DATA:  No results  found for this or any previous visit (from the past 48 hour(s)).    RADIOGRAPHY: No results found.     PATHOLOGY: None  ASSESSMENT/PLAN: Maureen [name] is a very pleasant 83 yo [place] American female with a recent history of anemia over the last few months. Her Hgb is 10.3 with an MCV of 77. She is symptomatic at this time with fatigue and chills, weakness, swishing in ears and SOB with exertion.  She has not had any obvious bleeding and Cologuard was negative. Iron studies and epo level are pending.  We will plan to bring her back in next week for IV iron or Aranesp if needed.  We will go ahead and plan to see her back again in 6 weeks for follow-up and lab.   All questions were answered. Both she and her daughter know to contact our office for questions or concerns. We can certainly see her much sooner if necessary.  She was discussed with and also seen by Dr. Myna Hidalgo and he is in agreement with the aforementioned.   [facility]    Addendum:  I saw and examined the patient with Armani Gawlik. I was at her blood on her the microscope. She has some microcytic red blood cells.  It is hard to say why she has the anemia. It may be multifactorial. At her age, myelodysplasia is always a consideration. However, I would not do a bone marrow test on her.  We clearly need to see what her erythropoietin level is. At her age, she may have a low erythropoietin level.  There is nothing on her physical exam that would suggest an etiology.  We spent about 40 minutes with she and her daughter. They're both very nice. We reviewed the labs. We answered their questions.  We will plan to get her back in 6 weeks.Marland Kitchen   Maureen Bach, MD

## 2017-07-14 LAB — IRON AND TIBC
%SAT: 18 % — AB (ref 21–57)
IRON: 35 ug/dL — AB (ref 41–142)
TIBC: 191 ug/dL — AB (ref 236–444)
UIBC: 156 ug/dL (ref 120–384)

## 2017-07-14 LAB — ERYTHROPOIETIN: Erythropoietin: 13.8 m[IU]/mL (ref 2.6–18.5)

## 2017-07-14 LAB — FERRITIN: FERRITIN: 472 ng/mL — AB (ref 9–269)

## 2017-07-14 LAB — RETICULOCYTES: Reticulocyte Count: 0.4 % — ABNORMAL LOW (ref 0.6–2.6)

## 2017-07-17 ENCOUNTER — Telehealth: Payer: Self-pay | Admitting: *Deleted

## 2017-07-17 DIAGNOSIS — M6281 Muscle weakness (generalized): Secondary | ICD-10-CM | POA: Diagnosis not present

## 2017-07-17 DIAGNOSIS — R293 Abnormal posture: Secondary | ICD-10-CM | POA: Diagnosis not present

## 2017-07-17 DIAGNOSIS — M25512 Pain in left shoulder: Secondary | ICD-10-CM | POA: Diagnosis not present

## 2017-07-17 DIAGNOSIS — M25511 Pain in right shoulder: Secondary | ICD-10-CM | POA: Diagnosis not present

## 2017-07-17 NOTE — Telephone Encounter (Addendum)
Patient is aware of results. Appointment made  ----- Message from Verdie MosherSarah M [place], NP sent at 07/14/2017  6:24 PM EDT ----- Regarding: Iron  Iron low, she will need one dose of IV iron. [facility]. Thank you!!  Maralyn SagoSarah  ----- Message ----- From: Interface, Lab In Three Zero One Sent: 07/13/2017   1:57 PM To: Verdie MosherSarah M [place], NP

## 2017-07-18 ENCOUNTER — Other Ambulatory Visit: Payer: Self-pay | Admitting: Family

## 2017-07-18 ENCOUNTER — Ambulatory Visit (HOSPITAL_BASED_OUTPATIENT_CLINIC_OR_DEPARTMENT_OTHER): Payer: Medicare Other

## 2017-07-18 VITALS — BP 128/54 | HR 84 | Temp 97.9°F | Resp 16

## 2017-07-18 DIAGNOSIS — D508 Other iron deficiency anemias: Secondary | ICD-10-CM

## 2017-07-18 DIAGNOSIS — D649 Anemia, unspecified: Secondary | ICD-10-CM

## 2017-07-18 DIAGNOSIS — D509 Iron deficiency anemia, unspecified: Secondary | ICD-10-CM | POA: Insufficient documentation

## 2017-07-18 MED ORDER — SODIUM CHLORIDE 0.9 % IV SOLN
Freq: Once | INTRAVENOUS | Status: AC
  Administered 2017-07-18: 10:00:00 via INTRAVENOUS

## 2017-07-18 MED ORDER — SODIUM CHLORIDE 0.9 % IV SOLN
510.0000 mg | Freq: Once | INTRAVENOUS | Status: AC
  Administered 2017-07-18: 510 mg via INTRAVENOUS
  Filled 2017-07-18: qty 17

## 2017-07-18 NOTE — Patient Instructions (Signed)

## 2017-07-21 DIAGNOSIS — R293 Abnormal posture: Secondary | ICD-10-CM | POA: Diagnosis not present

## 2017-07-21 DIAGNOSIS — M25511 Pain in right shoulder: Secondary | ICD-10-CM | POA: Diagnosis not present

## 2017-07-21 DIAGNOSIS — M25512 Pain in left shoulder: Secondary | ICD-10-CM | POA: Diagnosis not present

## 2017-07-21 DIAGNOSIS — M6281 Muscle weakness (generalized): Secondary | ICD-10-CM | POA: Diagnosis not present

## 2017-07-24 DIAGNOSIS — R293 Abnormal posture: Secondary | ICD-10-CM | POA: Diagnosis not present

## 2017-07-24 DIAGNOSIS — M25511 Pain in right shoulder: Secondary | ICD-10-CM | POA: Diagnosis not present

## 2017-07-24 DIAGNOSIS — M6281 Muscle weakness (generalized): Secondary | ICD-10-CM | POA: Diagnosis not present

## 2017-07-24 DIAGNOSIS — M25512 Pain in left shoulder: Secondary | ICD-10-CM | POA: Diagnosis not present

## 2017-07-28 DIAGNOSIS — M25511 Pain in right shoulder: Secondary | ICD-10-CM | POA: Diagnosis not present

## 2017-07-28 DIAGNOSIS — M25512 Pain in left shoulder: Secondary | ICD-10-CM | POA: Diagnosis not present

## 2017-07-28 DIAGNOSIS — M6281 Muscle weakness (generalized): Secondary | ICD-10-CM | POA: Diagnosis not present

## 2017-07-28 DIAGNOSIS — R293 Abnormal posture: Secondary | ICD-10-CM | POA: Diagnosis not present

## 2017-08-01 DIAGNOSIS — M25511 Pain in right shoulder: Secondary | ICD-10-CM | POA: Diagnosis not present

## 2017-08-01 DIAGNOSIS — M25512 Pain in left shoulder: Secondary | ICD-10-CM | POA: Diagnosis not present

## 2017-08-01 DIAGNOSIS — R293 Abnormal posture: Secondary | ICD-10-CM | POA: Diagnosis not present

## 2017-08-01 DIAGNOSIS — M6281 Muscle weakness (generalized): Secondary | ICD-10-CM | POA: Diagnosis not present

## 2017-08-04 DIAGNOSIS — R293 Abnormal posture: Secondary | ICD-10-CM | POA: Diagnosis not present

## 2017-08-04 DIAGNOSIS — M25511 Pain in right shoulder: Secondary | ICD-10-CM | POA: Diagnosis not present

## 2017-08-04 DIAGNOSIS — M6281 Muscle weakness (generalized): Secondary | ICD-10-CM | POA: Diagnosis not present

## 2017-08-04 DIAGNOSIS — M25512 Pain in left shoulder: Secondary | ICD-10-CM | POA: Diagnosis not present

## 2017-08-08 DIAGNOSIS — M6281 Muscle weakness (generalized): Secondary | ICD-10-CM | POA: Diagnosis not present

## 2017-08-08 DIAGNOSIS — M25511 Pain in right shoulder: Secondary | ICD-10-CM | POA: Diagnosis not present

## 2017-08-08 DIAGNOSIS — R293 Abnormal posture: Secondary | ICD-10-CM | POA: Diagnosis not present

## 2017-08-08 DIAGNOSIS — M25512 Pain in left shoulder: Secondary | ICD-10-CM | POA: Diagnosis not present

## 2017-08-10 DIAGNOSIS — M25511 Pain in right shoulder: Secondary | ICD-10-CM | POA: Diagnosis not present

## 2017-08-10 DIAGNOSIS — M6281 Muscle weakness (generalized): Secondary | ICD-10-CM | POA: Diagnosis not present

## 2017-08-10 DIAGNOSIS — M25512 Pain in left shoulder: Secondary | ICD-10-CM | POA: Diagnosis not present

## 2017-08-10 DIAGNOSIS — R293 Abnormal posture: Secondary | ICD-10-CM | POA: Diagnosis not present

## 2017-08-15 DIAGNOSIS — R293 Abnormal posture: Secondary | ICD-10-CM | POA: Diagnosis not present

## 2017-08-15 DIAGNOSIS — M6281 Muscle weakness (generalized): Secondary | ICD-10-CM | POA: Diagnosis not present

## 2017-08-15 DIAGNOSIS — M25511 Pain in right shoulder: Secondary | ICD-10-CM | POA: Diagnosis not present

## 2017-08-15 DIAGNOSIS — M25512 Pain in left shoulder: Secondary | ICD-10-CM | POA: Diagnosis not present

## 2017-08-17 DIAGNOSIS — M6281 Muscle weakness (generalized): Secondary | ICD-10-CM | POA: Diagnosis not present

## 2017-08-17 DIAGNOSIS — M25512 Pain in left shoulder: Secondary | ICD-10-CM | POA: Diagnosis not present

## 2017-08-17 DIAGNOSIS — R293 Abnormal posture: Secondary | ICD-10-CM | POA: Diagnosis not present

## 2017-08-17 DIAGNOSIS — M25511 Pain in right shoulder: Secondary | ICD-10-CM | POA: Diagnosis not present

## 2017-08-21 DIAGNOSIS — M6281 Muscle weakness (generalized): Secondary | ICD-10-CM | POA: Diagnosis not present

## 2017-08-21 DIAGNOSIS — R293 Abnormal posture: Secondary | ICD-10-CM | POA: Diagnosis not present

## 2017-08-21 DIAGNOSIS — M25512 Pain in left shoulder: Secondary | ICD-10-CM | POA: Diagnosis not present

## 2017-08-21 DIAGNOSIS — M25511 Pain in right shoulder: Secondary | ICD-10-CM | POA: Diagnosis not present

## 2017-08-25 ENCOUNTER — Ambulatory Visit (HOSPITAL_BASED_OUTPATIENT_CLINIC_OR_DEPARTMENT_OTHER): Payer: Medicare Other | Admitting: Hematology & Oncology

## 2017-08-25 ENCOUNTER — Other Ambulatory Visit (HOSPITAL_BASED_OUTPATIENT_CLINIC_OR_DEPARTMENT_OTHER): Payer: Medicare Other

## 2017-08-25 ENCOUNTER — Other Ambulatory Visit: Payer: Self-pay

## 2017-08-25 ENCOUNTER — Ambulatory Visit: Payer: Self-pay | Admitting: Family

## 2017-08-25 VITALS — BP 174/75 | HR 80 | Temp 97.9°F | Resp 18 | Wt 131.0 lb

## 2017-08-25 DIAGNOSIS — K9089 Other intestinal malabsorption: Secondary | ICD-10-CM | POA: Diagnosis not present

## 2017-08-25 DIAGNOSIS — D509 Iron deficiency anemia, unspecified: Secondary | ICD-10-CM | POA: Diagnosis not present

## 2017-08-25 DIAGNOSIS — D649 Anemia, unspecified: Secondary | ICD-10-CM | POA: Diagnosis present

## 2017-08-25 DIAGNOSIS — R7301 Impaired fasting glucose: Secondary | ICD-10-CM

## 2017-08-25 DIAGNOSIS — N189 Chronic kidney disease, unspecified: Secondary | ICD-10-CM | POA: Diagnosis not present

## 2017-08-25 DIAGNOSIS — K5901 Slow transit constipation: Secondary | ICD-10-CM

## 2017-08-25 DIAGNOSIS — R634 Abnormal weight loss: Secondary | ICD-10-CM

## 2017-08-25 DIAGNOSIS — K625 Hemorrhage of anus and rectum: Secondary | ICD-10-CM

## 2017-08-25 LAB — COMPREHENSIVE METABOLIC PANEL
ALBUMIN: 3.2 g/dL — AB (ref 3.5–5.0)
ALK PHOS: 61 U/L (ref 40–150)
ALT: 15 U/L (ref 0–55)
AST: 25 U/L (ref 5–34)
Anion Gap: 7 mEq/L (ref 3–11)
BILIRUBIN TOTAL: 0.29 mg/dL (ref 0.20–1.20)
BUN: 17.1 mg/dL (ref 7.0–26.0)
CO2: 27 mEq/L (ref 22–29)
CREATININE: 0.9 mg/dL (ref 0.6–1.1)
Calcium: 9.7 mg/dL (ref 8.4–10.4)
Chloride: 104 mEq/L (ref 98–109)
EGFR: 65 mL/min/{1.73_m2} — ABNORMAL LOW (ref >90)
GLUCOSE: 93 mg/dL (ref 70–140)
Potassium: 4.4 mEq/L (ref 3.5–5.1)
SODIUM: 138 meq/L (ref 136–145)
TOTAL PROTEIN: 7.4 g/dL (ref 6.4–8.3)

## 2017-08-25 LAB — CBC WITH DIFFERENTIAL (CANCER CENTER ONLY)
BASO#: 0 10*3/uL (ref 0.0–0.2)
BASO%: 0 % (ref 0.0–2.0)
EOS ABS: 0.1 10*3/uL (ref 0.0–0.5)
EOS%: 1.7 % (ref 0.0–7.0)
HCT: 33.9 % — ABNORMAL LOW (ref 34.8–46.6)
HEMOGLOBIN: 11 g/dL — AB (ref 11.6–15.9)
LYMPH#: 0.6 10*3/uL — AB (ref 0.9–3.3)
LYMPH%: 17.3 % (ref 14.0–48.0)
MCH: 25.5 pg — ABNORMAL LOW (ref 26.0–34.0)
MCHC: 32.4 g/dL (ref 32.0–36.0)
MCV: 79 fL — ABNORMAL LOW (ref 81–101)
MONO#: 0.7 10*3/uL (ref 0.1–0.9)
MONO%: 18.8 % — AB (ref 0.0–13.0)
NEUT%: 62.2 % (ref 39.6–80.0)
NEUTROS ABS: 2.2 10*3/uL (ref 1.5–6.5)
Platelets: 173 10*3/uL (ref 145–400)
RBC: 4.32 10*6/uL (ref 3.70–5.32)
RDW: 19 % — ABNORMAL HIGH (ref 11.1–15.7)
WBC: 3.5 10*3/uL — AB (ref 3.9–10.0)

## 2017-08-25 LAB — IRON AND TIBC
%SAT: 32 % (ref 21–57)
Iron: 60 ug/dL (ref 41–142)
TIBC: 186 ug/dL — AB (ref 236–444)
UIBC: 127 ug/dL (ref 120–384)

## 2017-08-25 LAB — FERRITIN: Ferritin: 932 ng/ml — ABNORMAL HIGH (ref 9–269)

## 2017-08-25 NOTE — Progress Notes (Signed)
Hematology and Oncology Follow Up Visit  Maureen [name] [id number] 08/22/1933 83 y.o. 08/25/2017   Principle Diagnosis:   Anemia secondary to iron deficiency  Erythropoietin deficient anemia  Iron malabsorption  Current Therapy:    IV iron as indicated-dose given on 07/18/2017     Interim History:  Ms. Maureen [name] is back for follow-up. This is her second office visit. We initially saw her back in July. At that time, she was iron deficient. Her iron studies showed a ferritin of 472 with an iron saturation of 18%.  We gave her some IV iron. This seemed to help. Today, her ferritin is 932 with an iron saturation of 32%.  Of note, she has diabetes. Her blood sugar today was 93. She is on multiple hypoglycemic agents.  She does have some renal insufficiency. Her erythropoietin level is only 13.8.  She still does not feel well. She has new constipation. She's not noted any melena. She's had some abdominal discomfort. Her weight is down a little bit.  She also complains of some cough. She has a dry cough. She has some chest wall discomfort.  I think that we probably have to consider a CT scan on her to make sure there is no underlying malignancy that we need to worry about.  She's had no fever. She's had no leg swelling. She's had no headache.  She is worried about taking too many medications.  Overall, her performance status is ECOG 2.  Medications:  Current Outpatient Prescriptions:  .  escitalopram (LEXAPRO) 10 MG tablet, Take 10 mg by mouth., Disp: , Rfl:  .  metFORMIN (GLUCOPHAGE-XR) 500 MG 24 hr tablet, Take 500 mg by mouth., Disp: , Rfl:  .  amLODipine (NORVASC) 2.5 MG tablet, Take 2.5 mg by mouth daily., Disp: , Rfl:  .  aspirin EC 81 MG tablet, Take 81 mg by mouth daily., Disp: , Rfl:  .  atorvastatin (LIPITOR) 20 MG tablet, Take 1 tablet (20 mg total) by mouth daily. (Patient taking differently: Take 20 mg by mouth at bedtime. ), Disp: 90 tablet, Rfl: 3 .  celecoxib  (CELEBREX) 200 MG capsule, Take 200 mg by mouth daily., Disp: , Rfl:  .  diclofenac sodium (VOLTAREN) 1 % GEL, Apply 4 g topically 4 (four) times daily as needed (for pain)., Disp: , Rfl:  .  ergocalciferol (VITAMIN D2) 50000 units capsule, Take 50,000 Units by mouth every Sunday., Disp: , Rfl:  .  FLUoxetine (PROZAC) 20 MG capsule, Take 40 mg by mouth daily., Disp: , Rfl:  .  glipiZIDE (GLUCOTROL XL) 5 MG 24 hr tablet, Take 5 mg by mouth daily with breakfast., Disp: , Rfl:  .  pantoprazole (PROTONIX) 40 MG tablet, Take 40 mg by mouth., Disp: , Rfl:  .  propranolol (INDERAL) 20 MG tablet, Take 20 mg by mouth 2 (two) times daily., Disp: , Rfl:  .  sitaGLIPtin (JANUVIA) 100 MG tablet, Take 100 mg by mouth daily., Disp: , Rfl:   Allergies: No Known Allergies  Past Medical History, Surgical history, Social history, and Family History were reviewed and updated.  Review of Systems: Review of Systems  Constitutional: Positive for unexpected weight change.    Physical Exam:  weight is 131 lb (59.4 kg). Her oral temperature is 97.9 F (36.6 C). Her blood pressure is 174/75 (abnormal) and her pulse is 80. Her respiration is 18 and oxygen saturation is 98%.   Wt Readings from Last 3 Encounters:  08/25/17 131 lb (59.4 kg)  07/13/17 133 lb (60.3 kg)  10/05/15 169 lb (76.7 kg)    Physical Exam  Constitutional: She is oriented to person, place, and time.  HENT:  Head: Normocephalic and atraumatic.  Mouth/Throat: Oropharynx is clear and moist.  Eyes: Pupils are equal, round, and reactive to light. EOM are normal.  Neck: Normal range of motion.  Cardiovascular: Normal rate, regular rhythm and normal heart sounds.   Pulmonary/Chest: Effort normal and breath sounds normal.  Abdominal: Soft. Bowel sounds are normal.  Musculoskeletal: Normal range of motion. She exhibits no edema, tenderness or deformity.  Lymphadenopathy:    She has no cervical adenopathy.  Neurological: She is alert and  oriented to person, place, and time.  Skin: Skin is warm and dry. No rash noted. No erythema.  Psychiatric: She has a normal mood and affect. Her behavior is normal. Judgment and thought content normal.  Vitals reviewed.    Lab Results  Component Value Date   WBC 3.5 (L) 08/25/2017   HGB 11.0 (L) 08/25/2017   HCT 33.9 (L) 08/25/2017   MCV 79 (L) 08/25/2017   PLT 173 08/25/2017     Chemistry      Component Value Date/Time   NA 134 07/13/2017 1337   K 4.2 07/13/2017 1337   CL 97 (L) 07/13/2017 1337   CO2 30 07/13/2017 1337   BUN 13 07/13/2017 1337   CREATININE 1.0 07/13/2017 1337      Component Value Date/Time   CALCIUM 9.5 07/13/2017 1337   ALKPHOS 67 07/13/2017 1337   AST 27 07/13/2017 1337   ALT 19 07/13/2017 1337   BILITOT 0.60 07/13/2017 1337         Impression and Plan: Ms. Maureen [name] is a 83 year old African American female.  I do worry about the possibility of an underlying malignancy.  I do think a CT scan would be helpful.  She does not qualify for Aranesp with her hemoglobin being 11. I talked to she and her daughter about this.  Her iron levels are quite good. I am very happy about that. I wish she would feel better. It is possible at she does I be taking too many medications.  We will plan to see her back in another 3-4 weeks.  I spent a good 25 minutes with she and her daughter. I went over her labs with her. I explained my recommendations for the CT scan. I answered their questions and they were much more reassured that we were trying to be "proactive."   Josph Macho, MD 8/31/20189:42 AM

## 2017-08-29 DIAGNOSIS — M6281 Muscle weakness (generalized): Secondary | ICD-10-CM | POA: Diagnosis not present

## 2017-08-29 DIAGNOSIS — M25511 Pain in right shoulder: Secondary | ICD-10-CM | POA: Diagnosis not present

## 2017-08-29 DIAGNOSIS — M25512 Pain in left shoulder: Secondary | ICD-10-CM | POA: Diagnosis not present

## 2017-08-29 DIAGNOSIS — R293 Abnormal posture: Secondary | ICD-10-CM | POA: Diagnosis not present

## 2017-09-01 ENCOUNTER — Ambulatory Visit (HOSPITAL_BASED_OUTPATIENT_CLINIC_OR_DEPARTMENT_OTHER)
Admission: RE | Admit: 2017-09-01 | Discharge: 2017-09-01 | Disposition: A | Discharge status: Home / Self Care | Payer: Medicare Other | Source: Ambulatory Visit | Attending: Hematology & Oncology | Admitting: Hematology & Oncology

## 2017-09-01 ENCOUNTER — Encounter (HOSPITAL_BASED_OUTPATIENT_CLINIC_OR_DEPARTMENT_OTHER): Payer: Self-pay

## 2017-09-01 DIAGNOSIS — K5901 Slow transit constipation: Secondary | ICD-10-CM

## 2017-09-01 DIAGNOSIS — R634 Abnormal weight loss: Secondary | ICD-10-CM

## 2017-09-01 DIAGNOSIS — I701 Atherosclerosis of renal artery: Secondary | ICD-10-CM | POA: Insufficient documentation

## 2017-09-01 DIAGNOSIS — I2510 Atherosclerotic heart disease of native coronary artery without angina pectoris: Secondary | ICD-10-CM | POA: Diagnosis not present

## 2017-09-01 DIAGNOSIS — K319 Disease of stomach and duodenum, unspecified: Secondary | ICD-10-CM | POA: Insufficient documentation

## 2017-09-01 DIAGNOSIS — R7301 Impaired fasting glucose: Secondary | ICD-10-CM

## 2017-09-01 DIAGNOSIS — M5136 Other intervertebral disc degeneration, lumbar region: Secondary | ICD-10-CM | POA: Diagnosis not present

## 2017-09-01 DIAGNOSIS — K625 Hemorrhage of anus and rectum: Secondary | ICD-10-CM

## 2017-09-01 DIAGNOSIS — I700 Atherosclerosis of aorta: Secondary | ICD-10-CM | POA: Diagnosis not present

## 2017-09-01 DIAGNOSIS — R599 Enlarged lymph nodes, unspecified: Secondary | ICD-10-CM | POA: Insufficient documentation

## 2017-09-01 DIAGNOSIS — R05 Cough: Secondary | ICD-10-CM | POA: Diagnosis not present

## 2017-09-01 DIAGNOSIS — M47814 Spondylosis without myelopathy or radiculopathy, thoracic region: Secondary | ICD-10-CM | POA: Insufficient documentation

## 2017-09-01 DIAGNOSIS — M47816 Spondylosis without myelopathy or radiculopathy, lumbar region: Secondary | ICD-10-CM | POA: Diagnosis not present

## 2017-09-01 DIAGNOSIS — R935 Abnormal findings on diagnostic imaging of other abdominal regions, including retroperitoneum: Secondary | ICD-10-CM | POA: Insufficient documentation

## 2017-09-01 MED ORDER — IOPAMIDOL (ISOVUE-300) INJECTION 61%
100.0000 mL | Freq: Once | INTRAVENOUS | Status: AC | PRN
  Administered 2017-09-01: 100 mL via INTRAVENOUS

## 2017-09-05 DIAGNOSIS — M25512 Pain in left shoulder: Secondary | ICD-10-CM | POA: Diagnosis not present

## 2017-09-05 DIAGNOSIS — R293 Abnormal posture: Secondary | ICD-10-CM | POA: Diagnosis not present

## 2017-09-05 DIAGNOSIS — M6281 Muscle weakness (generalized): Secondary | ICD-10-CM | POA: Diagnosis not present

## 2017-09-05 DIAGNOSIS — M25511 Pain in right shoulder: Secondary | ICD-10-CM | POA: Diagnosis not present

## 2017-09-12 DIAGNOSIS — M25512 Pain in left shoulder: Secondary | ICD-10-CM | POA: Diagnosis not present

## 2017-09-12 DIAGNOSIS — R293 Abnormal posture: Secondary | ICD-10-CM | POA: Diagnosis not present

## 2017-09-12 DIAGNOSIS — M25511 Pain in right shoulder: Secondary | ICD-10-CM | POA: Diagnosis not present

## 2017-09-12 DIAGNOSIS — M6281 Muscle weakness (generalized): Secondary | ICD-10-CM | POA: Diagnosis not present

## 2017-09-15 ENCOUNTER — Ambulatory Visit (HOSPITAL_BASED_OUTPATIENT_CLINIC_OR_DEPARTMENT_OTHER): Payer: Medicare Other | Admitting: Hematology & Oncology

## 2017-09-15 ENCOUNTER — Other Ambulatory Visit (HOSPITAL_BASED_OUTPATIENT_CLINIC_OR_DEPARTMENT_OTHER): Payer: Medicare Other

## 2017-09-15 VITALS — BP 174/80 | HR 84 | Temp 97.8°F | Resp 19 | Wt 131.0 lb

## 2017-09-15 DIAGNOSIS — D509 Iron deficiency anemia, unspecified: Secondary | ICD-10-CM

## 2017-09-15 DIAGNOSIS — R599 Enlarged lymph nodes, unspecified: Secondary | ICD-10-CM

## 2017-09-15 DIAGNOSIS — K5901 Slow transit constipation: Secondary | ICD-10-CM

## 2017-09-15 DIAGNOSIS — R634 Abnormal weight loss: Secondary | ICD-10-CM | POA: Diagnosis not present

## 2017-09-15 DIAGNOSIS — R7301 Impaired fasting glucose: Secondary | ICD-10-CM

## 2017-09-15 DIAGNOSIS — K625 Hemorrhage of anus and rectum: Secondary | ICD-10-CM | POA: Diagnosis not present

## 2017-09-15 LAB — CBC WITH DIFFERENTIAL (CANCER CENTER ONLY)
BASO#: 0 10*3/uL (ref 0.0–0.2)
BASO%: 0 % (ref 0.0–2.0)
EOS ABS: 0.1 10*3/uL (ref 0.0–0.5)
EOS%: 1.9 % (ref 0.0–7.0)
HCT: 35.5 % (ref 34.8–46.6)
HEMOGLOBIN: 11.5 g/dL — AB (ref 11.6–15.9)
LYMPH#: 0.8 10*3/uL — ABNORMAL LOW (ref 0.9–3.3)
LYMPH%: 21.6 % (ref 14.0–48.0)
MCH: 25.8 pg — AB (ref 26.0–34.0)
MCHC: 32.4 g/dL (ref 32.0–36.0)
MCV: 80 fL — AB (ref 81–101)
MONO#: 0.5 10*3/uL (ref 0.1–0.9)
MONO%: 14.4 % — AB (ref 0.0–13.0)
NEUT%: 62.1 % (ref 39.6–80.0)
NEUTROS ABS: 2.2 10*3/uL (ref 1.5–6.5)
Platelets: 175 10*3/uL (ref 145–400)
RBC: 4.46 10*6/uL (ref 3.70–5.32)
RDW: 19 % — ABNORMAL HIGH (ref 11.1–15.7)
WBC: 3.6 10*3/uL — AB (ref 3.9–10.0)

## 2017-09-15 LAB — CMP (CANCER CENTER ONLY)
ALBUMIN: 3.1 g/dL — AB (ref 3.3–5.5)
ALT(SGPT): 23 U/L (ref 10–47)
AST: 36 U/L (ref 11–38)
Alkaline Phosphatase: 62 U/L (ref 26–84)
BUN, Bld: 12 mg/dL (ref 7–22)
CHLORIDE: 105 meq/L (ref 98–108)
CO2: 29 meq/L (ref 18–33)
Calcium: 9.2 mg/dL (ref 8.0–10.3)
Creat: 1 mg/dl (ref 0.6–1.2)
Glucose, Bld: 101 mg/dL (ref 73–118)
POTASSIUM: 4.3 meq/L (ref 3.3–4.7)
SODIUM: 143 meq/L (ref 128–145)
TOTAL PROTEIN: 6.9 g/dL (ref 6.4–8.1)
Total Bilirubin: 0.5 mg/dl (ref 0.20–1.60)

## 2017-09-15 LAB — RETICULOCYTES: Reticulocyte Count: 0.5 % — ABNORMAL LOW (ref 0.6–2.6)

## 2017-09-15 LAB — TECHNOLOGIST REVIEW CHCC SATELLITE

## 2017-09-15 NOTE — Progress Notes (Signed)
Hematology and Oncology Follow Up Visit  Maureen [name] [id number] January 16, 1933 83 y.o. 09/15/2017   Principle Diagnosis:   Anemia secondary to iron deficiency  Erythropoietin deficient anemia  Iron malabsorption  Current Therapy:    IV iron as indicated-dose given on 07/18/2017     Interim History:  Maureen [name] is back for follow-up. Maureen [name] is doing pretty well. Her daughter thinks that Maureen [name] might be eating a little bit better.  We gave her iron back in July. Since then, her hemoglobin has gradually increased.  Back in late August, her ferritin was 932 with an iron saturation of 32%.  I believe that Maureen [name] would benefit from a colonoscopy and possibly upper endoscopy. We did do a CT scan on her. The CT scan was done last week. This CT scan report shows some slight wall thickening in the stomach. Maureen [name] has severe vascular disease. Maureen [name] has a 1 cm right external iliac node. Maureen [name] had a couple benign-appearing lesions in the liver.  Maureen [name] has had no fever. Maureen [name] has had no obvious bleeding. Maureen [name]'s had no cough or shortness of breath. Maureen [name]'s had no leg swelling.  Overall, her performance status is ECOG 2   Medications:  Current Outpatient Prescriptions:  .  amLODipine (NORVASC) 2.5 MG tablet, Take 2.5 mg by mouth daily., Disp: , Rfl:  .  aspirin EC 81 MG tablet, Take 81 mg by mouth daily., Disp: , Rfl:  .  atorvastatin (LIPITOR) 20 MG tablet, Take 1 tablet (20 mg total) by mouth daily. (Patient taking differently: Take 20 mg by mouth at bedtime. ), Disp: 90 tablet, Rfl: 3 .  celecoxib (CELEBREX) 200 MG capsule, Take 200 mg by mouth daily., Disp: , Rfl:  .  diclofenac sodium (VOLTAREN) 1 % GEL, Apply 4 g topically 4 (four) times daily as needed (for pain)., Disp: , Rfl:  .  ergocalciferol (VITAMIN D2) 50000 units capsule, Take 50,000 Units by mouth every Sunday., Disp: , Rfl:  .  escitalopram (LEXAPRO) 10 MG tablet, Take 10 mg by mouth., Disp: , Rfl:  .  FLUoxetine (PROZAC) 20 MG capsule, Take 40 mg  by mouth daily., Disp: , Rfl:  .  glipiZIDE (GLUCOTROL XL) 5 MG 24 hr tablet, Take 5 mg by mouth daily with breakfast., Disp: , Rfl:  .  metFORMIN (GLUCOPHAGE-XR) 500 MG 24 hr tablet, Take 500 mg by mouth., Disp: , Rfl:  .  pantoprazole (PROTONIX) 40 MG tablet, Take 40 mg by mouth., Disp: , Rfl:  .  propranolol (INDERAL) 20 MG tablet, Take 20 mg by mouth 2 (two) times daily., Disp: , Rfl:  .  sitaGLIPtin (JANUVIA) 100 MG tablet, Take 100 mg by mouth daily., Disp: , Rfl:   Allergies: No Known Allergies  Past Medical History, Surgical history, Social history, and Family History were reviewed and updated.  Review of Systems: Review of Systems  Constitutional: Positive for unexpected weight change.    Physical Exam:  weight is 131 lb (59.4 kg). Her oral temperature is 97.8 F (36.6 C). Her blood pressure is 174/80 (abnormal) and her pulse is 84. Her respiration is 19 and oxygen saturation is 100%.   Wt Readings from Last 3 Encounters:  09/15/17 131 lb (59.4 kg)  08/25/17 131 lb (59.4 kg)  07/13/17 133 lb (60.3 kg)    Physical Exam  Constitutional: Maureen [name] is oriented to person, place, and time.  HENT:  Head: Normocephalic and atraumatic.  Mouth/Throat: Oropharynx is clear and moist.  Eyes: Pupils are equal, round, and  reactive to light. EOM are normal.  Neck: Normal range of motion.  Cardiovascular: Normal rate, regular rhythm and normal heart sounds.   Pulmonary/Chest: Effort normal and breath sounds normal.  Abdominal: Soft. Bowel sounds are normal.  Musculoskeletal: Normal range of motion. Maureen [name] exhibits no edema, tenderness or deformity.  Lymphadenopathy:    Maureen [name] has no cervical adenopathy.  Neurological: Maureen [name] is alert and oriented to person, place, and time.  Skin: Skin is warm and dry. No rash noted. No erythema.  Psychiatric: Maureen [name] has a normal mood and affect. Her behavior is normal. Judgment and thought content normal.  Vitals reviewed.    Lab Results  Component Value Date     WBC 3.6 (L) 09/15/2017   HGB 11.5 (L) 09/15/2017   HCT 35.5 09/15/2017   MCV 80 (L) 09/15/2017   PLT 175 09/15/2017     Chemistry      Component Value Date/Time   NA 143 09/15/2017 1301   NA 138 08/25/2017 0835   K 4.3 09/15/2017 1301   K 4.4 08/25/2017 0835   CL 105 09/15/2017 1301   CO2 29 09/15/2017 1301   CO2 27 08/25/2017 0835   BUN 12 09/15/2017 1301   BUN 17.1 08/25/2017 0835   CREATININE 1.0 09/15/2017 1301   CREATININE 0.9 08/25/2017 0835      Component Value Date/Time   CALCIUM 9.2 09/15/2017 1301   CALCIUM 9.7 08/25/2017 0835   ALKPHOS 62 09/15/2017 1301   ALKPHOS 61 08/25/2017 0835   AST 36 09/15/2017 1301   AST 25 08/25/2017 0835   ALT 23 09/15/2017 1301   ALT 15 08/25/2017 0835   BILITOT 0.50 09/15/2017 1301   BILITOT 0.29 08/25/2017 0835         Impression and Plan: Maureen [name] is a 83 year old African American female.  I'm glad that the scan does not show anything obvious. However, I do believe that a upper and lower endoscopy would be reasonable.  I talked to Maureen [name] and her daughter about this. I spent about 25 minutes with them. They agree to have this done. Maureen [name] is a little bit reluctant however.  I will like to see her back in another couple months. I want to see her back before the holidays so we can make sure that her blood is adequate so Maureen [name] will feel good during the holidays.  Of note, her erythropoietin level is only 14. As such, if we needed to, we could always give her Aranesp or Procrit.  Josph Macho, MD 9/21/20181:40 PM

## 2017-09-16 LAB — HEMOGLOBIN A1C
Est. average glucose Bld gHb Est-mCnc: 126 mg/dL
Hemoglobin A1c: 6 % — ABNORMAL HIGH (ref 4.8–5.6)

## 2017-09-18 ENCOUNTER — Telehealth: Payer: Self-pay | Admitting: *Deleted

## 2017-09-18 LAB — IRON AND TIBC
%SAT: 28 % (ref 21–57)
Iron: 52 ug/dL (ref 41–142)
TIBC: 186 ug/dL — ABNORMAL LOW (ref 236–444)
UIBC: 134 ug/dL (ref 120–384)

## 2017-09-18 LAB — FERRITIN: Ferritin: 530 ng/ml — ABNORMAL HIGH (ref 9–269)

## 2017-09-18 NOTE — Telephone Encounter (Addendum)
Patient is aware of results  ----- Message from Josph Macho, MD sent at 09/18/2017  9:58 AM EDT ----- Call - iron levels are ok!!  pete

## 2017-09-26 DIAGNOSIS — M25512 Pain in left shoulder: Secondary | ICD-10-CM | POA: Diagnosis not present

## 2017-09-26 DIAGNOSIS — M6281 Muscle weakness (generalized): Secondary | ICD-10-CM | POA: Diagnosis not present

## 2017-09-26 DIAGNOSIS — R293 Abnormal posture: Secondary | ICD-10-CM | POA: Diagnosis not present

## 2017-09-26 DIAGNOSIS — M25511 Pain in right shoulder: Secondary | ICD-10-CM | POA: Diagnosis not present

## 2017-10-06 DIAGNOSIS — R935 Abnormal findings on diagnostic imaging of other abdominal regions, including retroperitoneum: Secondary | ICD-10-CM | POA: Diagnosis not present

## 2017-10-06 DIAGNOSIS — D509 Iron deficiency anemia, unspecified: Secondary | ICD-10-CM | POA: Diagnosis not present

## 2017-10-20 DIAGNOSIS — K5730 Diverticulosis of large intestine without perforation or abscess without bleeding: Secondary | ICD-10-CM | POA: Diagnosis not present

## 2017-10-20 DIAGNOSIS — K3189 Other diseases of stomach and duodenum: Secondary | ICD-10-CM | POA: Diagnosis not present

## 2017-10-20 DIAGNOSIS — D509 Iron deficiency anemia, unspecified: Secondary | ICD-10-CM | POA: Diagnosis not present

## 2017-10-20 DIAGNOSIS — R933 Abnormal findings on diagnostic imaging of other parts of digestive tract: Secondary | ICD-10-CM | POA: Diagnosis not present

## 2017-10-20 DIAGNOSIS — R634 Abnormal weight loss: Secondary | ICD-10-CM | POA: Diagnosis not present

## 2017-10-26 DIAGNOSIS — K3189 Other diseases of stomach and duodenum: Secondary | ICD-10-CM | POA: Diagnosis not present

## 2017-11-15 ENCOUNTER — Ambulatory Visit: Payer: Self-pay | Admitting: Family

## 2017-11-15 ENCOUNTER — Other Ambulatory Visit: Payer: Self-pay

## 2018-01-19 DIAGNOSIS — E119 Type 2 diabetes mellitus without complications: Secondary | ICD-10-CM | POA: Diagnosis not present

## 2018-01-19 DIAGNOSIS — Z961 Presence of intraocular lens: Secondary | ICD-10-CM | POA: Diagnosis not present

## 2018-01-19 DIAGNOSIS — H40013 Open angle with borderline findings, low risk, bilateral: Secondary | ICD-10-CM | POA: Diagnosis not present

## 2018-01-19 DIAGNOSIS — H04123 Dry eye syndrome of bilateral lacrimal glands: Secondary | ICD-10-CM | POA: Diagnosis not present

## 2018-11-26 IMAGING — CR DG KNEE COMPLETE 4+V*L*
4 series · 4 of 4 positions shown · non-contrast
Comparison: Prior radiograph from 12/03/2013.

CLINICAL DATA: Initial evaluation for worsening chronic joint pain
for 3 days. No injury.

EXAM:
LEFT KNEE - COMPLETE 4+ VIEW

[x knee ap left (1 of 3)]
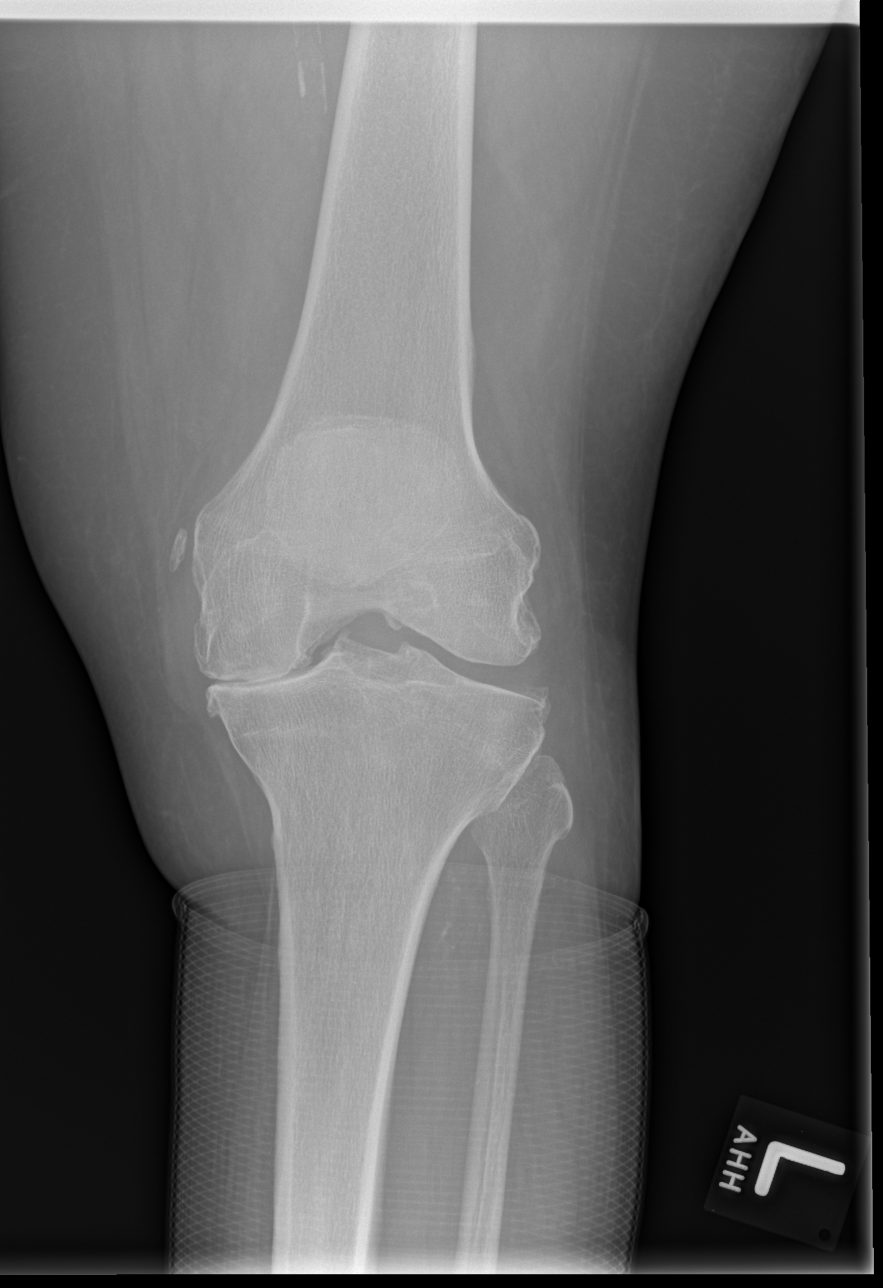

[x knee ap left (2 of 3)]
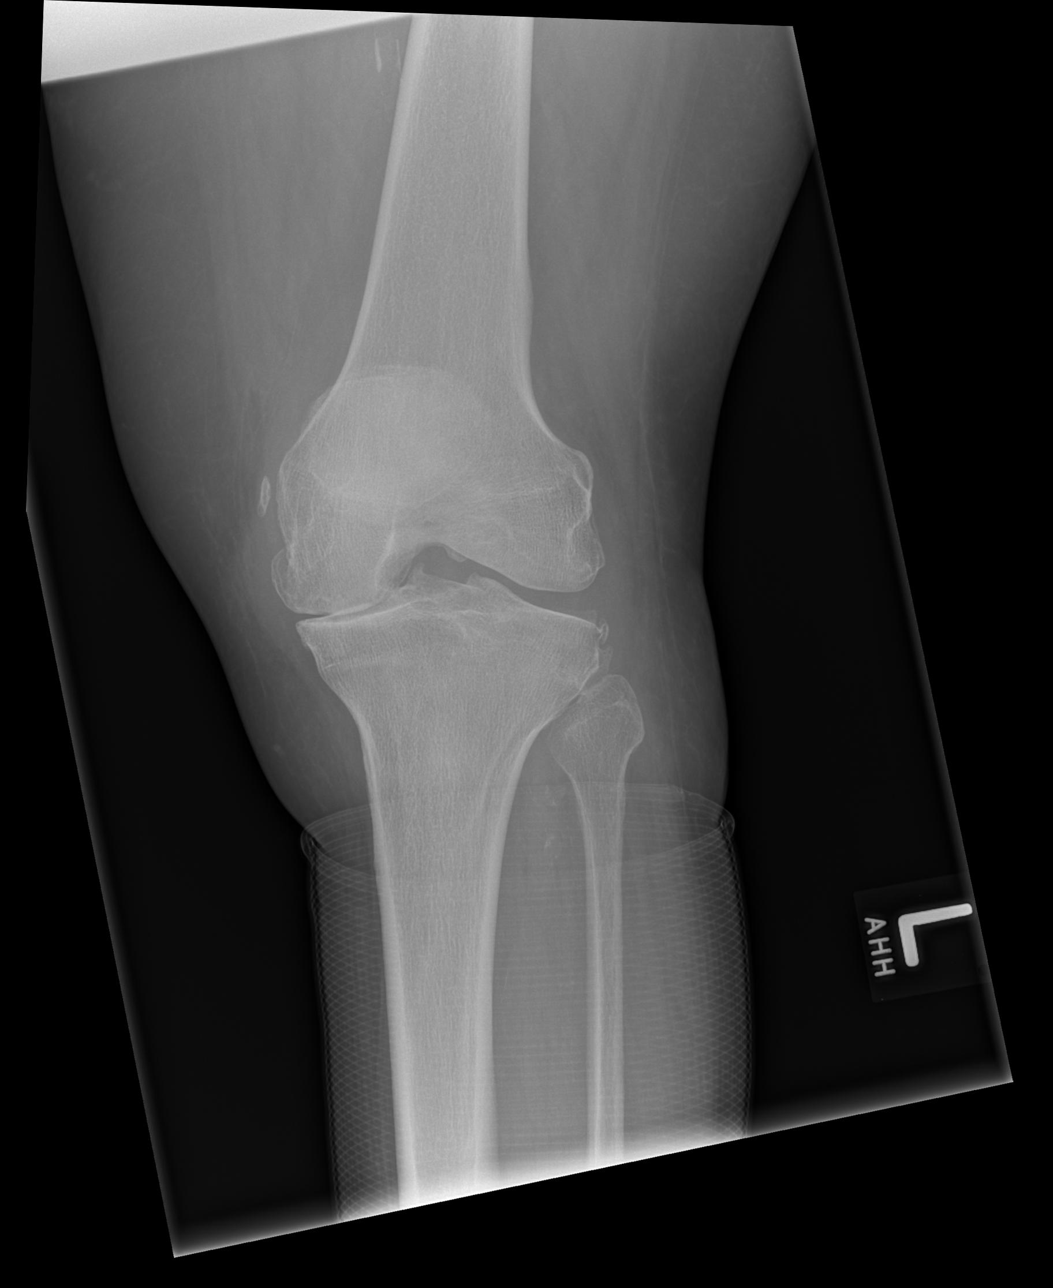

[x knee ap left (3 of 3)]
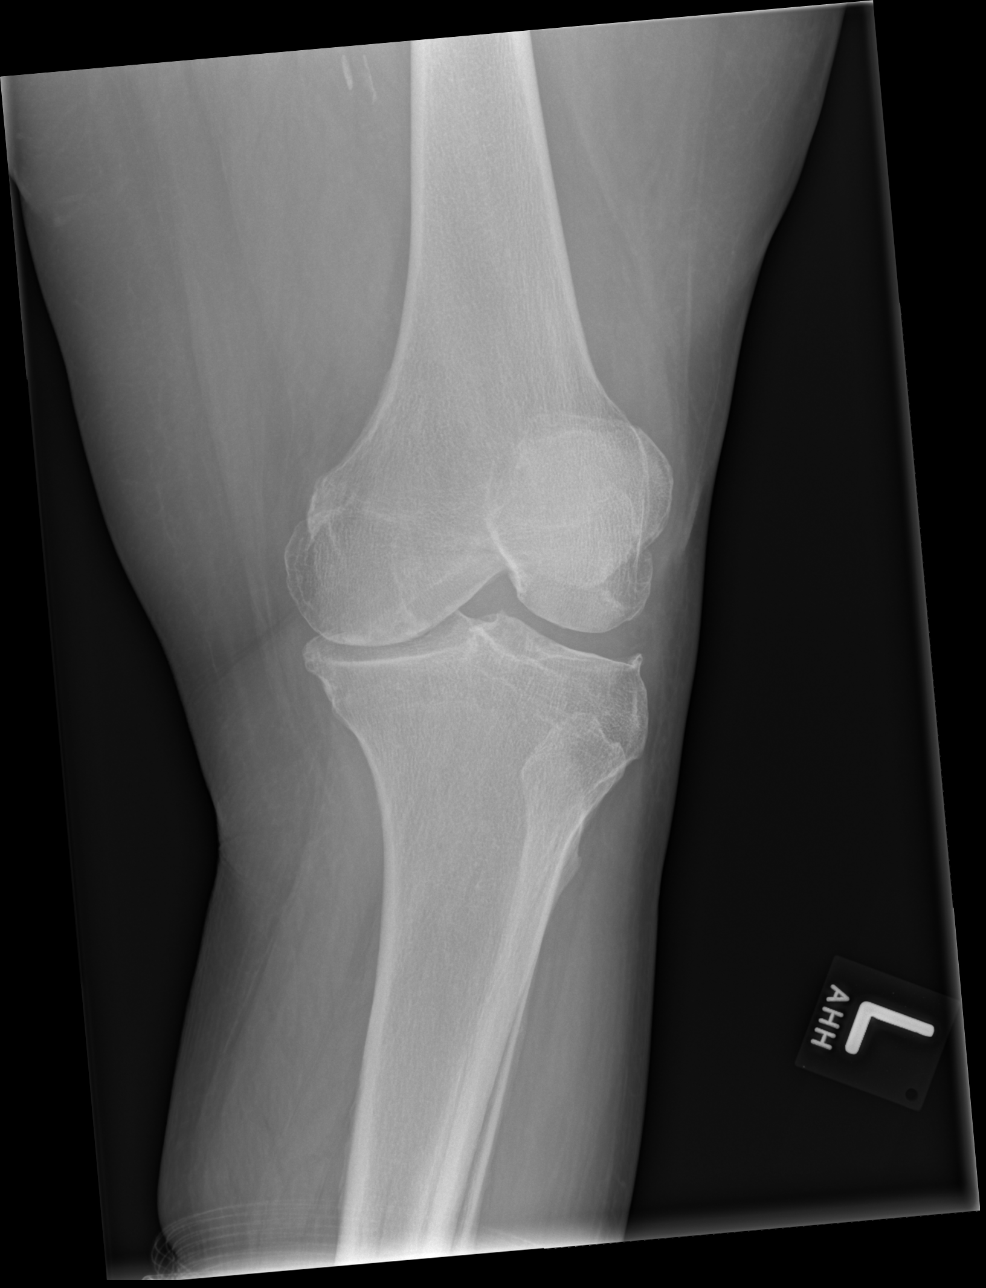

[x knee lat left]
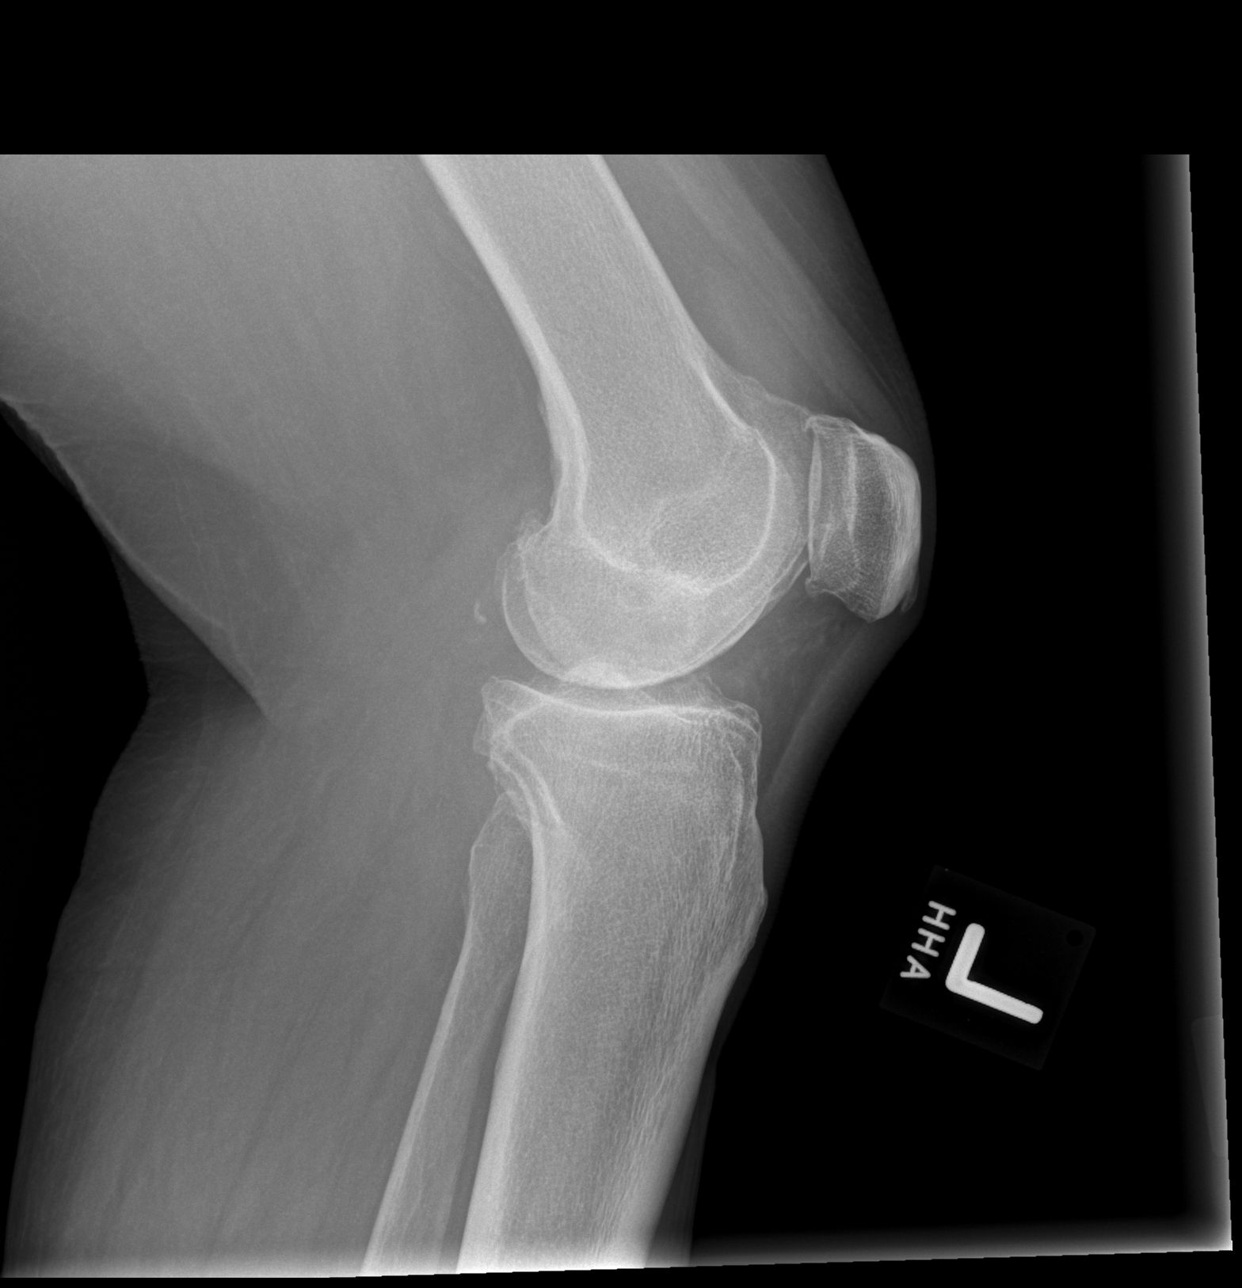

[4 of 4 positions shown; findings below may reference images not displayed]

FINDINGS: No acute fracture or dislocation. No significant joint effusion.
Moderate to advanced tricompartmental degenerative osteoarthrosis,
greatest within the medial femoral tibial joint space compartment,
advanced from prior. Osseous mineralization normal. No acute soft
tissue abnormality. Vascular calcifications noted within the 5.
IMPRESSION: 1. No acute osseous abnormality about the knee.
2. Moderate to advanced tricompartmental degenerative
osteoarthrosis, greatest within the medial femorotibial joint space
compartment. Changes are advanced relative to 0765.

## 2019-01-22 DIAGNOSIS — E119 Type 2 diabetes mellitus without complications: Secondary | ICD-10-CM | POA: Diagnosis not present

## 2019-01-22 DIAGNOSIS — Z961 Presence of intraocular lens: Secondary | ICD-10-CM | POA: Diagnosis not present

## 2019-01-22 DIAGNOSIS — H40023 Open angle with borderline findings, high risk, bilateral: Secondary | ICD-10-CM | POA: Diagnosis not present

## 2019-01-22 DIAGNOSIS — H04123 Dry eye syndrome of bilateral lacrimal glands: Secondary | ICD-10-CM | POA: Diagnosis not present

## 2019-06-03 IMAGING — CT CT ABD-PELV W/ CM
2 of 5 series · 13 of 36 positions shown, 16 images · IV contrast (APPLIED)
Comparison: MR angiography of the abdomen from 12/15/2006.

CLINICAL DATA: Weight loss since February 2017 with rectal bleeding
and recent cough.

EXAM:
CT CHEST, ABDOMEN, AND PELVIS WITH CONTRAST
TECHNIQUE: Multidetector CT imaging of the chest, abdomen and pelvis was
performed following the standard protocol during bolus
administration of intravenous contrast.
CONTRAST:  100mL [id number] IOPAMIDOL ([id number]) INJECTION 61%

[Series 2: cap with 2 · axial · 0.94mm/px · z∈[-595,-55]mm · 10 of 133 slices shown, 13 images]
[im 13/133  mediastinal]
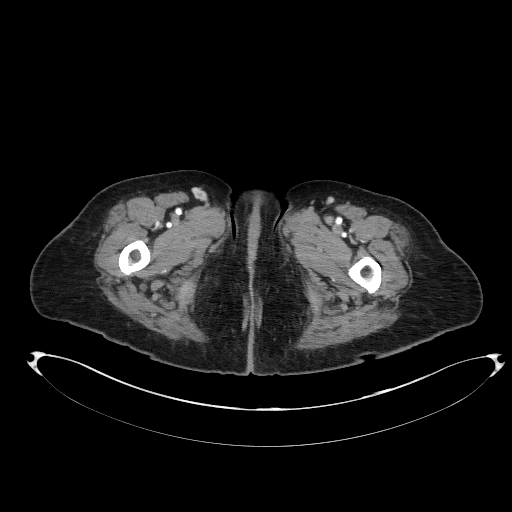
[im 13/133  lung]
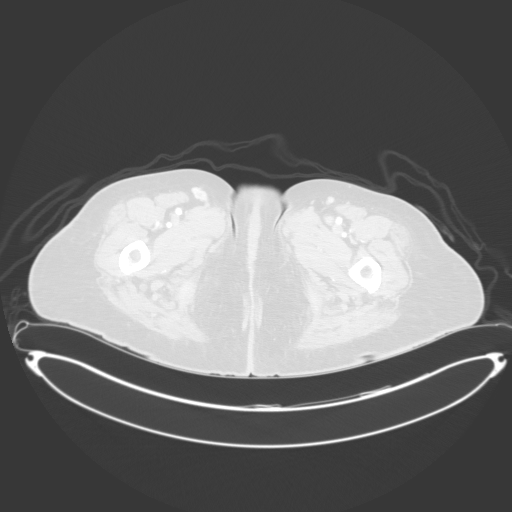
[im 25/133  lung]
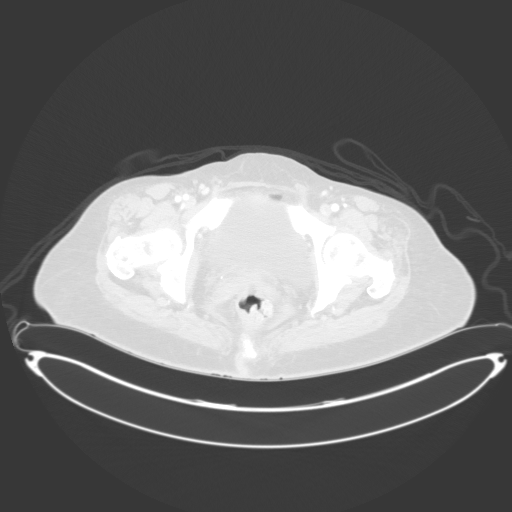
[im 37/133  lung]
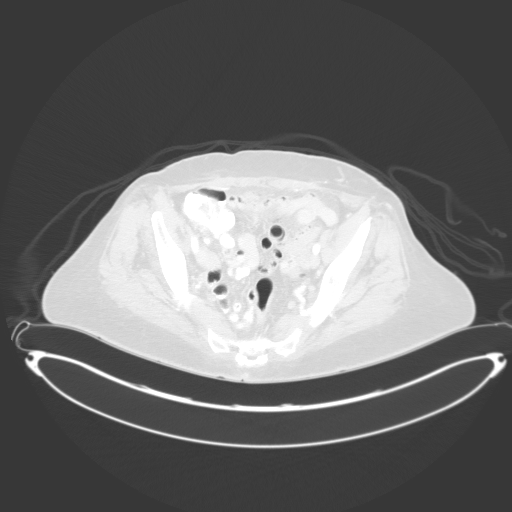
[im 49/133  lung]
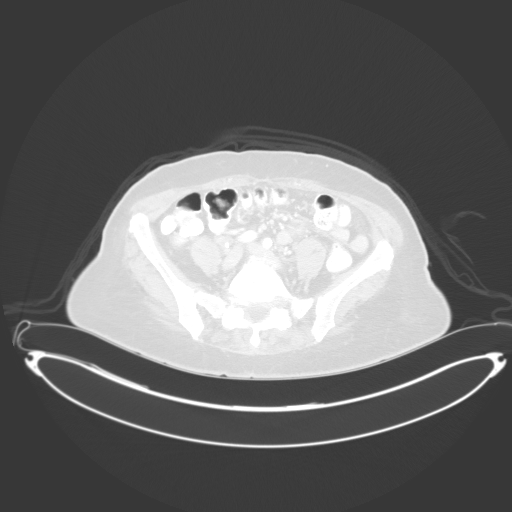
[im 61/133  mediastinal]
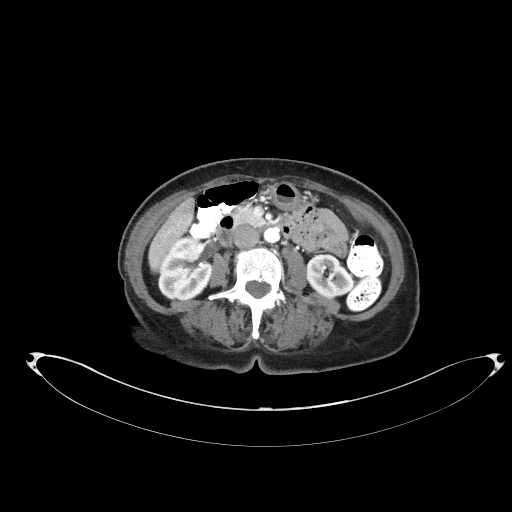
[im 61/133  lung]
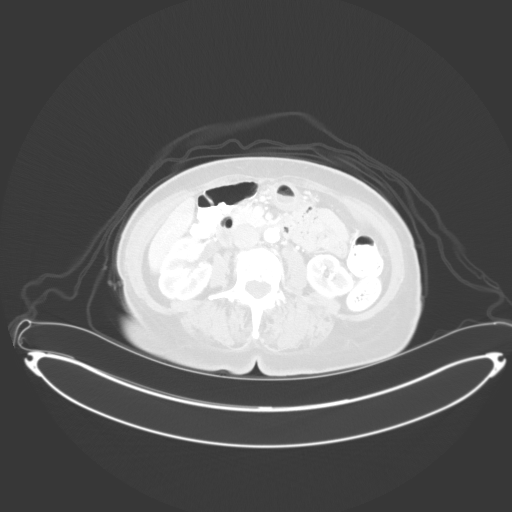
[im 73/133  lung]
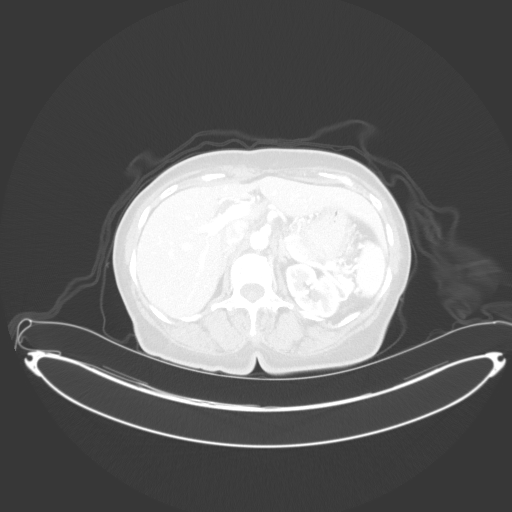
[im 85/133  lung]
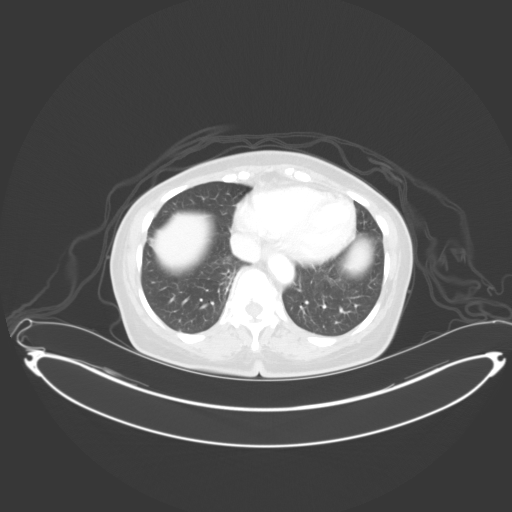
[im 97/133  lung]
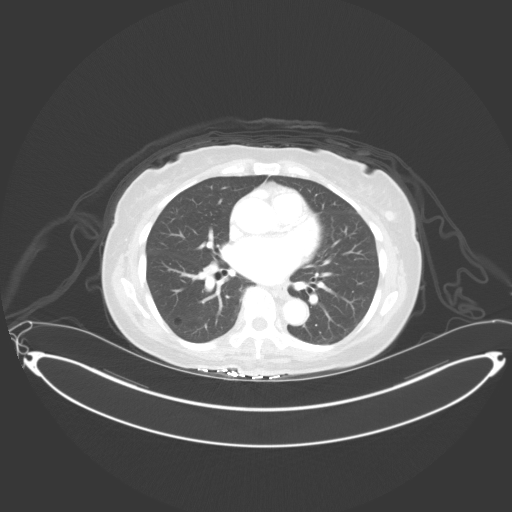
[im 109/133  mediastinal]
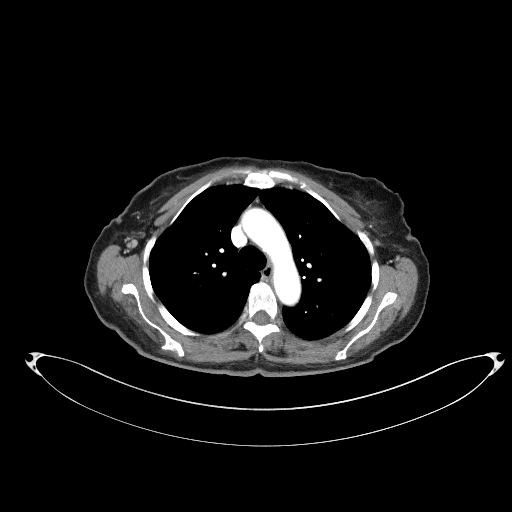
[im 109/133  lung]
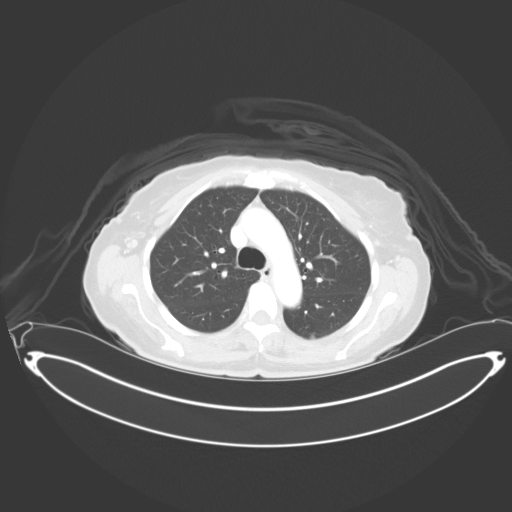
[im 121/133  lung]
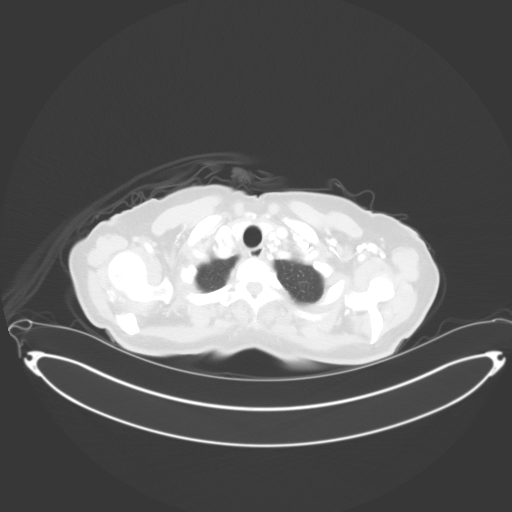

[Series 4: coronals · coronal · 0.82mm/px · 3 of 116 slices shown]
[im 24/116  lung]
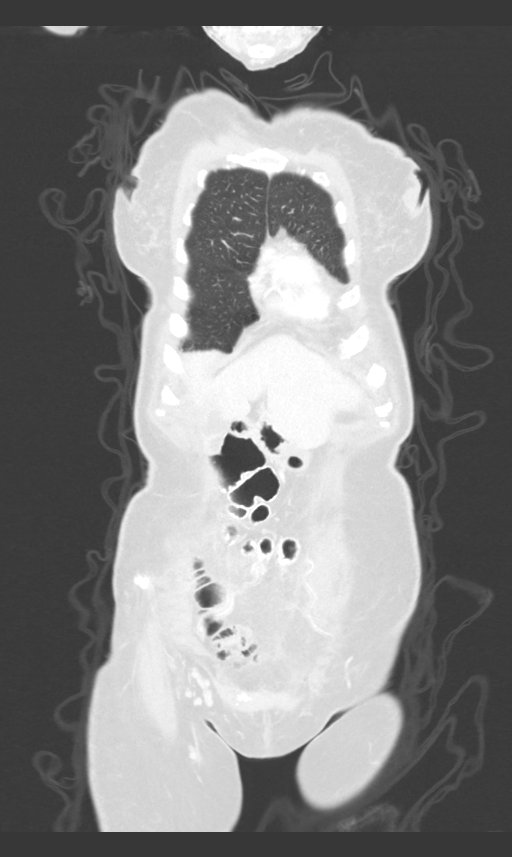
[im 47/116  lung]
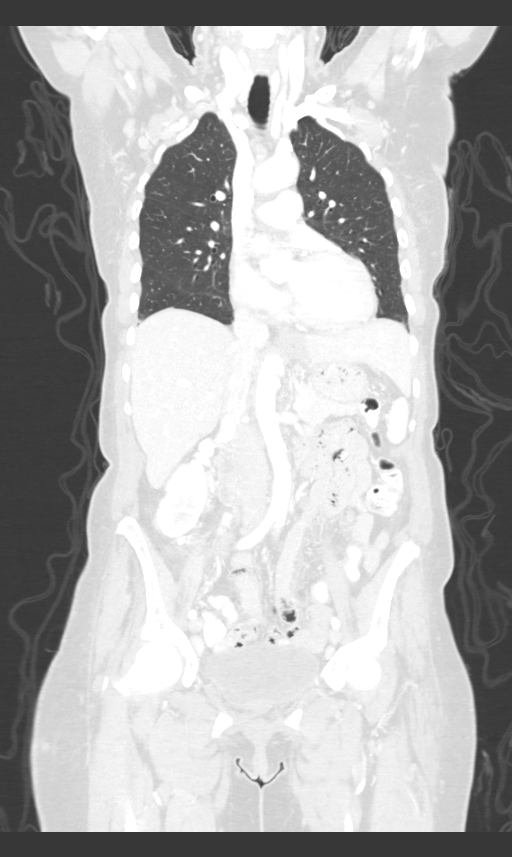
[im 70/116  lung]
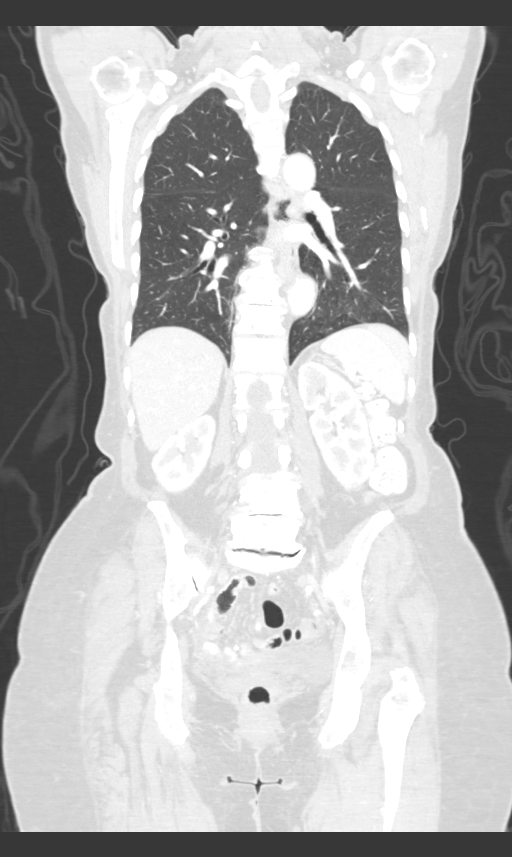

[13 of 36 positions shown; findings below may reference images not displayed]

FINDINGS: CT CHEST FINDINGS

Cardiovascular: Coronary, aortic arch, and branch vessel
atherosclerotic vascular disease.

Mediastinum/Nodes: Small bilateral axillary lymph nodes are present
with an index node in the left axilla measuring 0.9 cm in short axis
on image [DATE]. No pathologic thoracic adenopathy is identified.

Lungs/Pleura: Accessory fissure in the left upper lobe. No
significant findings.

Musculoskeletal: Lower cervical spondylosis. Mild thoracic
spondylosis.

CT ABDOMEN PELVIS FINDINGS

Hepatobiliary: 0.9 by 0.6 cm hypodense lesion in segment 2 of the
liver on image 52/2, also present in 6224. Somewhat elongated
morphology of the left hepatic lobe. Gallbladder unremarkable. No
significant biliary dilatation.

Pancreas: Unremarkable

Spleen: Unremarkable

Adrenals/Urinary Tract: Unremarkable

Stomach/Bowel: Equivocal wall thickening in the stomach antrum on
image 72/2 circumferentially, antritis not excluded.

Vascular/Lymphatic: Aortoiliac atherosclerotic vascular disease.
Severe atheromatous narrowing of the proximal right renal artery.

1 cm right external iliac node, image 103/2. 0.8 cm left external
iliac node, image 103/2.

Reproductive: Uterus absent.  Ovaries within normal limits.

Other: Equivocal hazy stranding in the omentum without nodularity or
caking.

Musculoskeletal: Spondylosis and degenerative disc disease at all
lumbar levels.
IMPRESSION: 1. Specific causes for the patient's cough, rectal bleeding, and
weight loss are not identified. Correlation with the patient's colon
cancer screening history is recommended. If screening is not
up-to-date, appropriate screening should be considered.
2.  Aortic Atherosclerosis ([id number]).  Coronary atherosclerosis.
3. Borderline enlarged right external iliac node, nonspecific.
4. Thoracic and lumbar spondylosis with lumbar degenerative disc
disease.
5. Equivocal wall thickening in the stomach antrum, could reflect
antritis.
6. Severe atheromatous narrowing of the proximal right renal artery.
7. Equivocal hazy stranding in the omentum, but no omental
nodularity.

## 2019-11-05 DIAGNOSIS — E559 Vitamin D deficiency, unspecified: Secondary | ICD-10-CM | POA: Diagnosis not present

## 2019-11-05 DIAGNOSIS — D509 Iron deficiency anemia, unspecified: Secondary | ICD-10-CM | POA: Diagnosis not present

## 2019-11-05 DIAGNOSIS — Z23 Encounter for immunization: Secondary | ICD-10-CM | POA: Diagnosis not present

## 2019-11-05 DIAGNOSIS — E785 Hyperlipidemia, unspecified: Secondary | ICD-10-CM | POA: Diagnosis not present

## 2019-11-05 DIAGNOSIS — E119 Type 2 diabetes mellitus without complications: Secondary | ICD-10-CM | POA: Diagnosis not present

## 2019-11-05 DIAGNOSIS — R5383 Other fatigue: Secondary | ICD-10-CM | POA: Diagnosis not present

## 2019-11-05 DIAGNOSIS — I10 Essential (primary) hypertension: Secondary | ICD-10-CM | POA: Diagnosis not present

## 2019-11-07 DIAGNOSIS — I10 Essential (primary) hypertension: Secondary | ICD-10-CM | POA: Diagnosis not present

## 2019-11-07 DIAGNOSIS — R5382 Chronic fatigue, unspecified: Secondary | ICD-10-CM | POA: Diagnosis not present

## 2019-11-08 ENCOUNTER — Other Ambulatory Visit: Payer: Self-pay

## 2019-11-08 ENCOUNTER — Emergency Department (HOSPITAL_COMMUNITY)
Admission: EM | Admit: 2019-11-08 | Discharge: 2019-11-09 | Disposition: A | Discharge status: Home / Self Care | Payer: Medicare Other | Attending: Emergency Medicine | Admitting: Emergency Medicine

## 2019-11-08 DIAGNOSIS — R251 Tremor, unspecified: Secondary | ICD-10-CM | POA: Diagnosis not present

## 2019-11-08 DIAGNOSIS — T50995A Adverse effect of other drugs, medicaments and biological substances, initial encounter: Secondary | ICD-10-CM | POA: Diagnosis not present

## 2019-11-08 DIAGNOSIS — R110 Nausea: Secondary | ICD-10-CM | POA: Diagnosis not present

## 2019-11-08 DIAGNOSIS — E119 Type 2 diabetes mellitus without complications: Secondary | ICD-10-CM | POA: Insufficient documentation

## 2019-11-08 DIAGNOSIS — R1084 Generalized abdominal pain: Secondary | ICD-10-CM | POA: Diagnosis not present

## 2019-11-08 DIAGNOSIS — I10 Essential (primary) hypertension: Secondary | ICD-10-CM | POA: Insufficient documentation

## 2019-11-08 DIAGNOSIS — F419 Anxiety disorder, unspecified: Secondary | ICD-10-CM | POA: Diagnosis not present

## 2019-11-08 DIAGNOSIS — T887XXA Unspecified adverse effect of drug or medicament, initial encounter: Secondary | ICD-10-CM

## 2019-11-08 DIAGNOSIS — T465X5A Adverse effect of other antihypertensive drugs, initial encounter: Secondary | ICD-10-CM | POA: Diagnosis not present

## 2019-11-08 NOTE — ED Triage Notes (Signed)
Patient states that she checked her BP tonight and it was elevated so she came in. States "I don't feel bad though".

## 2019-11-09 DIAGNOSIS — I10 Essential (primary) hypertension: Secondary | ICD-10-CM | POA: Diagnosis not present

## 2019-11-09 LAB — BASIC METABOLIC PANEL
Anion gap: 15 (ref 5–15)
BUN: 21 mg/dL (ref 8–23)
CO2: 22 mmol/L (ref 22–32)
Calcium: 10 mg/dL (ref 8.9–10.3)
Chloride: 99 mmol/L (ref 98–111)
Creatinine, Ser: 1.12 mg/dL — ABNORMAL HIGH (ref 0.44–1.00)
GFR calc Af Amer: 52 mL/min — ABNORMAL LOW (ref >60)
GFR calc non Af Amer: 45 mL/min — ABNORMAL LOW (ref >60)
Glucose, Bld: 121 mg/dL — ABNORMAL HIGH (ref 70–99)
Potassium: 3.6 mmol/L (ref 3.5–5.1)
Sodium: 136 mmol/L (ref 135–145)

## 2019-11-09 LAB — CBC
HCT: 48.9 % — ABNORMAL HIGH (ref 36.0–46.0)
Hemoglobin: 15.8 g/dL — ABNORMAL HIGH (ref 12.0–15.0)
MCH: 26.1 pg (ref 26.0–34.0)
MCHC: 32.3 g/dL (ref 30.0–36.0)
MCV: 80.7 fL (ref 80.0–100.0)
Platelets: 219 10*3/uL (ref 150–400)
RBC: 6.06 MIL/uL — ABNORMAL HIGH (ref 3.87–5.11)
RDW: 15.7 % — ABNORMAL HIGH (ref 11.5–15.5)
WBC: 9.4 10*3/uL (ref 4.0–10.5)
nRBC: 0 % (ref 0.0–0.2)

## 2019-11-09 LAB — TROPONIN I (HIGH SENSITIVITY)
Troponin I (High Sensitivity): 47 ng/L — ABNORMAL HIGH (ref <18)
Troponin I (High Sensitivity): 51 ng/L — ABNORMAL HIGH (ref <18)

## 2019-11-09 MED ORDER — LABETALOL HCL 5 MG/ML IV SOLN
5.0000 mg | Freq: Once | INTRAVENOUS | Status: AC
  Administered 2019-11-09: 5 mg via INTRAVENOUS
  Filled 2019-11-09: qty 4

## 2019-11-09 MED ORDER — LISINOPRIL-HYDROCHLOROTHIAZIDE 10-12.5 MG PO TABS: 1.0000 | ORAL_TABLET | Freq: Every day | ORAL | 0 refills | Status: AC

## 2019-11-09 NOTE — ED Notes (Signed)
Patient verbalizes understanding of discharge instructions. Opportunity for questioning and answers were provided. Armband removed by staff, pt discharged from ED.  

## 2019-11-09 NOTE — ED Provider Notes (Signed)
[facility] DEPART[facility] Provider Note   CSN: [id number] Arrival date & time: 11/08/19  2344     History   Chief Complaint Chief Complaint  Patient presents with   Hypertension    HPI Maureen [name] is a 85 y.o. female with a history of hypertension, hyperlipidemia, gout, anxiety, and anemia who presents to the emergency department with a chief complaint of elevated blood pressure.   The patient went to her dentist office on 11/9 and was told that her blood pressure was significantly elevated, and he would be unable to treat her and that she should follow up with primary care.  States that she was feeling asymptomatic at that time.  Reports that she stopped her home blood pressure medication on her own 1 year ago per her daughter because "she'd been on it for years and didn't feel like it was helping her at all and every time she went to the doctor they would put her on more medication each time."   Was seen the next day by her primary care provider and was started on 1/2 tablet of amlodipine-valsartan 5-320 daily. The patient reports nausea , tremors, and generalized abdominal discomfort since onset of the medication.  She also reports that she developed a "roaring" tinnitus in her left ear after starting the medication.  She reports that she was also given a prescription of metoprolol, but has not yet started this medication.  The patient's daughter reports that they checked the patient's blood pressure this morning and got a reading of 177/28.  Around noon, the patient began feeling queasy and tremulous and they rechecked her blood pressure and it was 193/87.  They called her primary care provider's office and was told to keep a close eye on her blood pressure and they would see her in the office when they reopened on Monday morning, but she was advised to go to the ER if her blood pressure increase.  She reports that she checked it at home tonight and it was  207/97 so she came to the ER for further evaluation.   She denies chest pain, shortness of breath, vomiting, difficulty voiding, numbness, weakness, slurred speech, visual changes, fever, chills, or leg swelling.     The history is provided by the patient. No language interpreter was used.    Past Medical History:  Diagnosis Date   Anemia    Anxiety    Arthritis    knees, shoulders   Diabetes mellitus without complication (HCC)    GERD (gastroesophageal reflux disease)    Gout    Gout    Hyperlipidemia    Hypertension    IFG (impaired fasting glucose)     Patient Active Problem List   Diagnosis Date Noted   IDA (iron deficiency anemia) 07/18/2017   Osteoarthritis of both knees 06/15/2014   Type II or unspecified type diabetes mellitus without mention of complication, not stated as uncontrolled 06/15/2014   Diaphoresis 11/02/2013   Chest tightness 11/02/2013   Anxiety state, unspecified 11/02/2013   Osteoarthritis 06/03/2013   Impaired fasting glucose 04/28/2013   Essential hypertension, benign 04/28/2013   Other malaise and fatigue 04/28/2013   Encounter for therapeutic drug monitoring 04/28/2013   Unspecified vitamin D deficiency 04/28/2013   Other and unspecified hyperlipidemia 04/11/2013   Unspecified essential hypertension 04/11/2013   Gout 04/11/2013    Past Surgical History:  Procedure Laterality Date   ABDOMINAL HYSTERECTOMY     CARPAL TUNNEL RELEASE Right 10/05/2015  Procedure: RIGHT ENDOSCOPIC CARPAL TUNNEL RELEASE;  Surgeon: Mack Hookavid Thompson, MD;  Location: [facility];  Service: Orthopedics;  Laterality: Right;     OB History   No obstetric history on file.      Home Medications    Prior to Admission medications   Medication Sig Start Date End Date Taking? Authorizing Provider  metoprolol succinate (TOPROL-XL) 50 MG 24 hr tablet Take 50 mg by mouth at bedtime. 11/05/19  Yes [provider]    amLODipine-valsartan (EXFORGE) 5-320 MG tablet Take 0.5 tablets by mouth 2 (two) times daily.  11/05/19 11/09/19 Yes [provider]  lisinopril-hydrochlorothiazide (ZESTORETIC) 10-12.5 MG tablet Take 1 tablet by mouth daily. 11/09/19 12/09/19  Kvon Mcilhenny A, PA-C  atorvastatin (LIPITOR) 20 MG tablet Take 1 tablet (20 mg total) by mouth daily. Patient not taking: Reported on 11/09/2019 10/05/15 11/09/19  Mack Hookhompson, David, MD    Family History Family History  Problem Relation Age of Onset   Stroke Mother    Cancer Brother        prostate    Social History Social History   Tobacco Use   Smoking status: Never Smoker   Smokeless tobacco: Never Used  Substance Use Topics   Alcohol use: No   Drug use: No     Allergies   Patient has no known allergies.   Review of Systems Review of Systems  Constitutional: Negative for activity change, chills and fever.  HENT: Positive for tinnitus.   Respiratory: Negative for shortness of breath and stridor.   Cardiovascular: Negative for chest pain, palpitations and leg swelling.  Gastrointestinal: Positive for abdominal pain and nausea. Negative for blood in stool, constipation, diarrhea and vomiting.  Genitourinary: Negative for dysuria.  Musculoskeletal: Negative for back pain, joint swelling, myalgias, neck pain and neck stiffness.  Skin: Negative for rash and wound.  Allergic/Immunologic: Negative for immunocompromised state.  Neurological: Positive for tremors. Negative for dizziness, seizures, syncope, weakness, light-headedness, numbness and headaches.  Psychiatric/Behavioral: Negative for confusion.   Physical Exam Updated Vital Signs BP (!) 195/74    Pulse 70    Temp 97.6 F (36.4 C) (Oral)    Resp (!) 26    SpO2 98%   Physical Exam Vitals signs and nursing note reviewed.  Constitutional:      General: She is not in acute distress.    Comments: Elderly female. NAD.  Anxious appearing.   HENT:     Head:  Normocephalic.  Eyes:     Conjunctiva/sclera: Conjunctivae normal.  Neck:     Musculoskeletal: Neck supple.  Cardiovascular:     Rate and Rhythm: Normal rate and regular rhythm.     Pulses: Normal pulses.     Heart sounds: Normal heart sounds. No murmur. No friction rub. No gallop.   Pulmonary:     Effort: Pulmonary effort is normal. No respiratory distress.     Breath sounds: No stridor. No wheezing, rhonchi or rales.  Chest:     Chest wall: No tenderness.  Abdominal:     General: There is no distension.     Palpations: Abdomen is soft. There is no mass.     Tenderness: There is no abdominal tenderness. There is no right CVA tenderness, left CVA tenderness, guarding or rebound.     Hernia: No hernia is present.     Comments: No focal tenderness to the abdomen.  Abdomen is soft and nondistended.  Musculoskeletal:     Right lower leg: No edema.  Left lower leg: No edema.  Skin:    General: Skin is warm.     Findings: No rash.  Neurological:     Mental Status: She is alert.     Comments: No neurologic deficits.  Psychiatric:        Behavior: Behavior normal.      ED Treatments / Results  Labs (all labs ordered are listed, but only abnormal results are displayed) Labs Reviewed  BASIC METABOLIC PANEL - Abnormal; Notable for the following components:      Result Value   Glucose, Bld 121 (*)    Creatinine, Ser 1.12 (*)    GFR calc non Af Amer 45 (*)    GFR calc Af Amer 52 (*)    All other components within normal limits  CBC - Abnormal; Notable for the following components:   RBC 6.06 (*)    Hemoglobin 15.8 (*)    HCT 48.9 (*)    RDW 15.7 (*)    All other components within normal limits  TROPONIN I (HIGH SENSITIVITY) - Abnormal; Notable for the following components:   Troponin I (High Sensitivity) 51 (*)    All other components within normal limits  TROPONIN I (HIGH SENSITIVITY) - Abnormal; Notable for the following components:   Troponin I (High Sensitivity) 47  (*)    All other components within normal limits    EKG EKG Interpretation  Date/Time:  Friday November 08 2019 23:57:09 EST Ventricular Rate:  72 PR Interval:  134 QRS Duration: 90 QT Interval:  422 QTC Calculation: 462 R Axis:   -62 Text Interpretation: Sinus rhythm with Premature atrial complexes Left anterior fascicular block Left ventricular hypertrophy ( R in aVL , Cornell product , Romhilt-Estes ) Abnormal ECG When compared with ECG of EARLIER SAME DATE No significant change was found Confirmed by Dione Booze ([id number]) on 11/09/2019 12:24:32 AM   Radiology No results found.  Procedures Procedures (including critical care time)  Medications Ordered in ED Medications  labetalol (NORMODYNE) injection 5 mg (5 mg Intravenous Given 11/09/19 0301)     Initial Impression / Assessment and Plan / ED Course  I have reviewed the triage vital signs and the nursing notes.  Pertinent labs & imaging results that were available during my care of the patient were reviewed by me and considered in my medical decision making (see chart for details).        85 year old female with a history of hypertension, hyperlipidemia, gout, anxiety, and anemia who presents to the emergency department with elevated blood pressure.  She discontinued her home antihypertensive medications on her own 1 year ago.  She was asymptomatic, but went to her dentist office earlier this week and had her blood pressure checked which was markedly elevated.  She was seen by her primary care provider the next day and was started on amlodipine losartan.  Unfortunately, she seems to be having side effects from the medication as shortly thereafter she developed abdominal pain and nausea that has persisted since starting the medication.   She has markedly hypertensive on arrival with blood pressure of 213/77.  She has no symptoms at this time, but does appear anxious that she is having her blood drawn.  No headache or  neurologic deficits.  No neurologic deficits.  I suspect her blood pressure has been elevated for some time given her lack of symptoms.    The patient is actively denying chest pain, troponin was obtained by triage staff and was found to be  elevated at 51.  EKG with no changes from previous.  Repeat troponin is flat at 47. The patient was discussed and independently evaluated by Dr. Preston Fleeting, attending physician, who was in agreement with work-up and plan. Since troponin trend if flat, this is unlikely secondary to hypertensive urgency or emergency.  She was given 1 dose of IV labetalol in the ER with improvement in her BP to 195/74.    We will change her medication to lisinopril-hydrochlorothiazide given side effects from her recent prescription of amlodipine and losartan.  She has been advised to follow-up with primary care within the next week to have her blood pressure rechecked.  She is also given strict return precautions to the ER.  She is in no acute distress.  Safe for discharge to home with outpatient follow-up as indicated.  Final Clinical Impressions(s) / ED Diagnoses   Final diagnoses:  Hypertension, unspecified type  Non-dose-related adverse effect of medication, initial encounter    ED Discharge Orders         Ordered    lisinopril-hydrochlorothiazide (ZESTORETIC) 10-12.5 MG tablet  Daily     11/09/19 0355           Barkley Boards, PA-C 11/09/19 1610    Dione Booze, MD 11/09/19 [phone]

## 2019-11-09 NOTE — Discharge Instructions (Addendum)
Thank you for allowing me to care for you today in the Emergency Department.   Call to schedule a follow-up appointment with your primary care provider in about 1 week.  However, if you start having side effects from the medication, including nausea, vomiting, abdominal pain you may want to follow-up with them sooner.   Take 1 tablet of lisinopril-hydrochlorothiazide (Zestoretic) by mouth daily.   Stop taking the amlodipine-losartan medication that you were given by primary care due to the side effects it was causing you.  Do not start taking the metoprolol prescription that you were given by primary care until you follow-up with them.   Although your blood pressure is high today, it is likely been high for some time.  As we discussed, there is not a reason to admit you to the hospital for this given the blood work that we did today.  The important thing is to get you started on a medication to lower your blood pressure adequately in time.  However, if you develop severe chest pain, headache, new numbness or weakness, significant new changes in your vision, if you become unable to urinate, or develop other new, concerning symptoms, you should return to the emergency department for reevaluation.

## 2019-11-10 ENCOUNTER — Encounter (HOSPITAL_COMMUNITY): Payer: Self-pay | Admitting: Emergency Medicine

## 2019-11-10 ENCOUNTER — Other Ambulatory Visit: Payer: Self-pay

## 2019-11-10 ENCOUNTER — Emergency Department (HOSPITAL_COMMUNITY)
Admission: EM | Admit: 2019-11-10 | Discharge: 2019-11-10 | Disposition: A | Discharge status: Home / Self Care | Payer: Medicare Other | Attending: Emergency Medicine | Admitting: Emergency Medicine

## 2019-11-10 DIAGNOSIS — Z79899 Other long term (current) drug therapy: Secondary | ICD-10-CM | POA: Insufficient documentation

## 2019-11-10 DIAGNOSIS — I10 Essential (primary) hypertension: Secondary | ICD-10-CM | POA: Diagnosis not present

## 2019-11-10 DIAGNOSIS — E119 Type 2 diabetes mellitus without complications: Secondary | ICD-10-CM | POA: Diagnosis not present

## 2019-11-10 LAB — CBC WITH DIFFERENTIAL/PLATELET
Abs Immature Granulocytes: 0.04 10*3/uL (ref 0.00–0.07)
Basophils Absolute: 0 10*3/uL (ref 0.0–0.1)
Basophils Relative: 1 %
Eosinophils Absolute: 0 10*3/uL (ref 0.0–0.5)
Eosinophils Relative: 0 %
HCT: 45.2 % (ref 36.0–46.0)
Hemoglobin: 14.6 g/dL (ref 12.0–15.0)
Immature Granulocytes: 1 %
Lymphocytes Relative: 20 %
Lymphs Abs: 1.6 10*3/uL (ref 0.7–4.0)
MCH: 25.9 pg — ABNORMAL LOW (ref 26.0–34.0)
MCHC: 32.3 g/dL (ref 30.0–36.0)
MCV: 80.3 fL (ref 80.0–100.0)
Monocytes Absolute: 0.7 10*3/uL (ref 0.1–1.0)
Monocytes Relative: 8 %
Neutro Abs: 5.7 10*3/uL (ref 1.7–7.7)
Neutrophils Relative %: 70 %
Platelets: 197 10*3/uL (ref 150–400)
RBC: 5.63 MIL/uL — ABNORMAL HIGH (ref 3.87–5.11)
RDW: 15.6 % — ABNORMAL HIGH (ref 11.5–15.5)
WBC: 8.1 10*3/uL (ref 4.0–10.5)
nRBC: 0 % (ref 0.0–0.2)

## 2019-11-10 LAB — BASIC METABOLIC PANEL
Anion gap: 12 (ref 5–15)
BUN: 22 mg/dL (ref 8–23)
CO2: 22 mmol/L (ref 22–32)
Calcium: 9.6 mg/dL (ref 8.9–10.3)
Chloride: 104 mmol/L (ref 98–111)
Creatinine, Ser: 1.07 mg/dL — ABNORMAL HIGH (ref 0.44–1.00)
GFR calc Af Amer: 55 mL/min — ABNORMAL LOW (ref >60)
GFR calc non Af Amer: 48 mL/min — ABNORMAL LOW (ref >60)
Glucose, Bld: 108 mg/dL — ABNORMAL HIGH (ref 70–99)
Potassium: 3.7 mmol/L (ref 3.5–5.1)
Sodium: 138 mmol/L (ref 135–145)

## 2019-11-10 LAB — TROPONIN I (HIGH SENSITIVITY): Troponin I (High Sensitivity): 22 ng/L — ABNORMAL HIGH (ref <18)

## 2019-11-10 MED ORDER — LISINOPRIL 10 MG PO TABS: 10.0000 mg | ORAL_TABLET | Freq: Every day | ORAL | 0 refills | Status: DC

## 2019-11-10 MED ORDER — LISINOPRIL 30 MG PO TABS: 30.0000 mg | ORAL_TABLET | Freq: Every day | ORAL | 0 refills | Status: DC

## 2019-11-10 MED ORDER — LISINOPRIL 20 MG PO TABS
30.0000 mg | ORAL_TABLET | Freq: Once | ORAL | Status: AC
  Administered 2019-11-10: 30 mg via ORAL
  Filled 2019-11-10: qty 1

## 2019-11-10 MED ORDER — LABETALOL HCL 5 MG/ML IV SOLN
5.0000 mg | Freq: Once | INTRAVENOUS | Status: AC
  Administered 2019-11-10: 5 mg via INTRAVENOUS
  Filled 2019-11-10: qty 4

## 2019-11-10 NOTE — ED Provider Notes (Signed)
[facility] EMERGENCY DEPARTMENT Provider Note   CSN: [id number] Arrival date & time: 11/10/19  1402     History   Chief Complaint Chief Complaint  Patient presents with  . Recheck BP    HPI Maureen [name] is a 85 y.o. female.     HPI   Patient is an 85 year old female with a history of anemia, anxiety, arthritis, diabetes, GERD, gout, hyperlipidemia, hypertension, who presents to the emergency department today for a recheck of her blood pressure.  She was seen in the ED yesterday with elevated blood pressure.  Her blood pressure medications were titrated when she was discharged home.  She presents today because she was having elevated blood pressures at home and wanted to have it checked in the ED.  At this time she is asymptomatic.  Denies headaches, chest pain, shortness of breath, blurred vision, dizziness/lightheadedness, numbness/weakness.  She does state that she feels anxious knowing that her blood pressure is elevated.  She did not feel anxious until she noted her blood pressure was elevated at home.  Reviewed records from yesterday's visit where patient was seen for evaluation of hypertension.  At that time her work-up was reassuring and she was discharged on blood pressure medications and advised to follow-up with PCP.  Past Medical History:  Diagnosis Date  . Anemia   . Anxiety   . Arthritis    knees, shoulders  . Diabetes mellitus without complication ([facility])   . GERD (gastroesophageal reflux disease)   . Gout   . Gout   . Hyperlipidemia   . Hypertension   . IFG (impaired fasting glucose)     Patient Active Problem List   Diagnosis Date Noted  . IDA (iron deficiency anemia) 07/18/2017  . Osteoarthritis of both knees 06/15/2014  . Type II or unspecified type diabetes mellitus without mention of complication, not stated as uncontrolled 06/15/2014  . Diaphoresis 11/02/2013  . Chest tightness 11/02/2013  . Anxiety state, unspecified 11/02/2013   . Osteoarthritis 06/03/2013  . Impaired fasting glucose 04/28/2013  . Essential hypertension, benign 04/28/2013  . Other malaise and fatigue 04/28/2013  . Encounter for therapeutic drug monitoring 04/28/2013  . Unspecified vitamin D deficiency 04/28/2013  . Other and unspecified hyperlipidemia 04/11/2013  . Unspecified essential hypertension 04/11/2013  . Gout 04/11/2013    Past Surgical History:  Procedure Laterality Date  . ABDOMINAL HYSTERECTOMY    . CARPAL TUNNEL RELEASE Right 10/05/2015   Procedure: RIGHT ENDOSCOPIC CARPAL TUNNEL RELEASE;  Surgeon: Milly Jakob, MD;  Location: [facility];  Service: Orthopedics;  Laterality: Right;     OB History   No obstetric history on file.      Home Medications    Prior to Admission medications   Medication Sig Start Date End Date Taking? Authorizing Provider  lisinopril (ZESTRIL) 10 MG tablet Take 1 tablet (10 mg total) by mouth daily. 11/10/19 12/10/19  Allien Melberg S, PA-C  lisinopril-hydrochlorothiazide (ZESTORETIC) 10-12.5 MG tablet Take 1 tablet by mouth daily. 11/09/19 12/09/19  McDonald, Mia A, PA-C  metoprolol succinate (TOPROL-XL) 50 MG 24 hr tablet Take 50 mg by mouth at bedtime. 11/05/19   [provider]  amLODipine-valsartan (EXFORGE) 5-320 MG tablet Take 0.5 tablets by mouth 2 (two) times daily.  11/05/19 11/09/19  [provider]  atorvastatin (LIPITOR) 20 MG tablet Take 1 tablet (20 mg total) by mouth daily. Patient not taking: Reported on 11/09/2019 10/05/15 11/09/19  Milly Jakob, MD    Family History Family  History  Problem Relation Age of Onset  . Stroke Mother   . Cancer Brother        prostate    Social History Social History   Tobacco Use  . Smoking status: Never Smoker  . Smokeless tobacco: Never Used  Substance Use Topics  . Alcohol use: No  . Drug use: No     Allergies   Patient has no known allergies.   Review of Systems Review of Systems   Constitutional: Negative for fever.  HENT: Negative for ear pain and sore throat.   Eyes: Negative for visual disturbance.  Respiratory: Negative for cough and shortness of breath.   Cardiovascular: Negative for chest pain.  Gastrointestinal: Negative for abdominal pain, constipation, diarrhea, nausea and vomiting.  Genitourinary: Negative for decreased urine volume.  Musculoskeletal: Negative for back pain.  Skin: Negative for rash.  Neurological: Negative for dizziness, weakness, light-headedness, numbness and headaches.  All other systems reviewed and are negative.    Physical Exam Updated Vital Signs BP (!) 143/69   Pulse 64   Temp 98.4 F (36.9 C) (Oral)   Resp 16   SpO2 98%   Physical Exam Vitals signs and nursing note reviewed.  Constitutional:      General: She is not in acute distress.    Appearance: She is well-developed.  HENT:     Head: Normocephalic and atraumatic.  Eyes:     Extraocular Movements: Extraocular movements intact.     Conjunctiva/sclera: Conjunctivae normal.     Comments: No nystagmus  Neck:     Musculoskeletal: Neck supple.  Cardiovascular:     Rate and Rhythm: Normal rate and regular rhythm.     Heart sounds: No murmur.  Pulmonary:     Effort: Pulmonary effort is normal. No respiratory distress.     Breath sounds: Normal breath sounds.  Abdominal:     Palpations: Abdomen is soft.     Tenderness: There is no abdominal tenderness.  Skin:    General: Skin is warm and dry.  Neurological:     Mental Status: She is alert.     Comments: Mental Status:  Alert, thought content appropriate, able to give a coherent history. Speech fluent without evidence of aphasia. Able to follow 2 step commands without difficulty.  Cranial Nerves:  II:  Peripheral visual fields grossly normal, right pupil fixed and slightly dilate (hx surgery to this eye - suspect chronic) III,IV, VI: ptosis not present, extra-ocular motions intact bilaterally  V,VII: smile  symmetric, facial light touch sensation equal VIII: hearing grossly normal to voice  X: uvula elevates symmetrically  XI: bilateral shoulder shrug symmetric and strong XII: midline tongue extension without fassiculations Motor:  Normal tone. 5/5 strength of BUE and BLE major muscle groups including strong and equal grip strength and dorsiflexion/plantar flexion Sensory: light touch normal in all extremities. Cerebellar: normal finger-to-nose with bilateral upper extremities Gait: normal gait and balance.       ED Treatments / Results  Labs (all labs ordered are listed, but only abnormal results are displayed) Labs Reviewed  CBC WITH DIFFERENTIAL/PLATELET - Abnormal; Notable for the following components:      Result Value   RBC 5.63 (*)    MCH 25.9 (*)    RDW 15.6 (*)    All other components within normal limits  BASIC METABOLIC PANEL - Abnormal; Notable for the following components:   Glucose, Bld 108 (*)    Creatinine, Ser 1.07 (*)    GFR calc non  Af Amer 48 (*)    GFR calc Af Amer 55 (*)    All other components within normal limits  TROPONIN I (HIGH SENSITIVITY) - Abnormal; Notable for the following components:   Troponin I (High Sensitivity) 22 (*)    All other components within normal limits    EKG EKG Interpretation  Date/Time:  Sunday November 10 2019 16:03:47 EST Ventricular Rate:  70 PR Interval:    QRS Duration: 92 QT Interval:  423 QTC Calculation: 457 R Axis:   -60 Text Interpretation: Sinus rhythm Left anterior fascicular block Abnormal R-wave progression, early transition Left ventricular hypertrophy No significant change since last tracing Abnormal ECG Confirmed by Lockwood, Robert ([id number]) on 11/10/2019 5:11:49 PM   Radiology No results found.  Procedures Procedures (including critical care time)  Medications Ordered in ED Medications  lisinopril (ZESTRIL) tablet 30 mg (30 mg Oral Given 11/10/19 1606)  labetalol (NORMODYNE) injection 5 mg (5 mg  Intravenous Given 11/10/19 1612)     Initial Impression / Assessment and Plan / ED Course  I have reviewed the triage vital signs and the nursing notes.  Pertinent labs & imaging results that were available during my care of the patient were reviewed by me and considered in my medical decision making (see chart for details).   Final Clinical Impressions(s) / ED Diagnoses   Final diagnoses:  Hypertension, unspecified type   86 year old female with history of hypertension.  Seen in the ED yesterday for evaluation of elevated blood pressure.  Asymptomatic at that time.  Work-up unrevealing.  Blood pressure medications were titrated.  She reports compliance with taking them upon discharge.  Presents today because she wanted to have her blood pressure rechecked because it was high at home.  She continues to be asymptomatic.  Exam is benign.  No focal neuro deficits.  Given it is been 48 hours since last labs and she has had persistently elevated blood pressure during this time will recheck labs to ensure no AKI or elevated troponin.  Will check EKG.  Will give medications to help lower blood pressure.  Labs are stable, trop improving. ekg w/o ischemic changes. Low suspicion for htn emergency.   Currently on lisinopril-hctz 10-12.5 and metop 50mg. Will give lisinopril 10mg to increase her lisinopril to a total of 20mg.   Will have pt f/u with pcp and return if worse. Daughter at bedside voices understanding of the plan and reasons to return. All questions answered, pt stable for d/c.   ED Discharge Orders         Ordered    lisinopril (ZESTRIL) 30 MG tablet  Daily,   Status:  Discontinued     11/10/19 1850    lisinopril (ZESTRIL) 10 MG tablet  Daily     11 /15/20 1912           Shawnette Augello S, PA-C 11/10/19 1915    11/12/19, MD 11/10/19 2358

## 2019-11-10 NOTE — ED Triage Notes (Signed)
Pt states she feels fine.  States she just wants BP checked because she was here yesterday because BP "keeps going up and down."

## 2019-11-10 NOTE — Discharge Instructions (Signed)
Please continue taking your blood pressure medications.  I have added an increased dose of lisinopril to your regimen to help reduce your blood pressure.  Please take this in the morning.  Please follow-up with your regular doctor in the next week for reevaluation.  Continue to monitor your blood pressures at home.  You have any headaches, vision changes, numbness/weakness, dizziness, chest pain, shortness of breath or any new or worsening symptoms then please return to the emergency department immediately.

## 2019-11-10 NOTE — ED Notes (Signed)
Pt dc'd home w/all belongings, a/ox4, escorted to exit via wheel chair, driven home by daughter

## 2019-11-10 NOTE — ED Notes (Signed)
Multiple attempts to obtain automatic BP without success.  Attempting to obtain manual BP.

## 2019-11-11 DIAGNOSIS — I10 Essential (primary) hypertension: Secondary | ICD-10-CM | POA: Diagnosis not present

## 2019-11-18 DIAGNOSIS — R110 Nausea: Secondary | ICD-10-CM | POA: Diagnosis not present

## 2019-11-18 DIAGNOSIS — Z79899 Other long term (current) drug therapy: Secondary | ICD-10-CM | POA: Diagnosis not present

## 2019-11-18 DIAGNOSIS — I10 Essential (primary) hypertension: Secondary | ICD-10-CM | POA: Diagnosis not present

## 2020-01-03 DIAGNOSIS — I10 Essential (primary) hypertension: Secondary | ICD-10-CM | POA: Diagnosis not present

## 2020-01-22 DIAGNOSIS — H40023 Open angle with borderline findings, high risk, bilateral: Secondary | ICD-10-CM | POA: Diagnosis not present

## 2020-01-22 DIAGNOSIS — E119 Type 2 diabetes mellitus without complications: Secondary | ICD-10-CM | POA: Diagnosis not present

## 2020-01-22 DIAGNOSIS — H04123 Dry eye syndrome of bilateral lacrimal glands: Secondary | ICD-10-CM | POA: Diagnosis not present

## 2020-01-22 DIAGNOSIS — Z961 Presence of intraocular lens: Secondary | ICD-10-CM | POA: Diagnosis not present

## 2020-01-29 DIAGNOSIS — I10 Essential (primary) hypertension: Secondary | ICD-10-CM | POA: Diagnosis not present

## 2020-01-29 DIAGNOSIS — L299 Pruritus, unspecified: Secondary | ICD-10-CM | POA: Diagnosis not present

## 2020-01-29 DIAGNOSIS — M170 Bilateral primary osteoarthritis of knee: Secondary | ICD-10-CM | POA: Diagnosis not present

## 2020-01-29 DIAGNOSIS — E119 Type 2 diabetes mellitus without complications: Secondary | ICD-10-CM | POA: Diagnosis not present

## 2020-01-29 DIAGNOSIS — L853 Xerosis cutis: Secondary | ICD-10-CM | POA: Diagnosis not present

## 2020-03-20 DIAGNOSIS — I10 Essential (primary) hypertension: Secondary | ICD-10-CM | POA: Diagnosis not present

## 2020-03-26 DIAGNOSIS — M25562 Pain in left knee: Secondary | ICD-10-CM | POA: Diagnosis not present

## 2020-03-26 DIAGNOSIS — M25561 Pain in right knee: Secondary | ICD-10-CM | POA: Diagnosis not present

## 2020-03-26 DIAGNOSIS — M21162 Varus deformity, not elsewhere classified, left knee: Secondary | ICD-10-CM | POA: Diagnosis not present

## 2020-03-26 DIAGNOSIS — M1711 Unilateral primary osteoarthritis, right knee: Secondary | ICD-10-CM | POA: Diagnosis not present

## 2020-03-26 DIAGNOSIS — M170 Bilateral primary osteoarthritis of knee: Secondary | ICD-10-CM | POA: Diagnosis not present

## 2020-03-26 DIAGNOSIS — M25462 Effusion, left knee: Secondary | ICD-10-CM | POA: Diagnosis not present

## 2020-03-26 DIAGNOSIS — M21161 Varus deformity, not elsewhere classified, right knee: Secondary | ICD-10-CM | POA: Diagnosis not present

## 2020-03-26 DIAGNOSIS — M25361 Other instability, right knee: Secondary | ICD-10-CM | POA: Diagnosis not present

## 2020-04-02 DIAGNOSIS — M1712 Unilateral primary osteoarthritis, left knee: Secondary | ICD-10-CM | POA: Diagnosis not present

## 2020-04-02 DIAGNOSIS — M25462 Effusion, left knee: Secondary | ICD-10-CM | POA: Diagnosis not present

## 2020-04-02 DIAGNOSIS — M25562 Pain in left knee: Secondary | ICD-10-CM | POA: Diagnosis not present

## 2020-04-08 DIAGNOSIS — M25562 Pain in left knee: Secondary | ICD-10-CM | POA: Diagnosis not present

## 2020-04-08 DIAGNOSIS — M170 Bilateral primary osteoarthritis of knee: Secondary | ICD-10-CM | POA: Diagnosis not present

## 2020-04-08 DIAGNOSIS — M25561 Pain in right knee: Secondary | ICD-10-CM | POA: Diagnosis not present

## 2020-04-17 DIAGNOSIS — M170 Bilateral primary osteoarthritis of knee: Secondary | ICD-10-CM | POA: Diagnosis not present

## 2020-04-17 DIAGNOSIS — M25562 Pain in left knee: Secondary | ICD-10-CM | POA: Diagnosis not present

## 2020-04-17 DIAGNOSIS — M25561 Pain in right knee: Secondary | ICD-10-CM | POA: Diagnosis not present

## 2020-04-22 DIAGNOSIS — M25561 Pain in right knee: Secondary | ICD-10-CM | POA: Diagnosis not present

## 2020-04-22 DIAGNOSIS — M25562 Pain in left knee: Secondary | ICD-10-CM | POA: Diagnosis not present

## 2020-04-22 DIAGNOSIS — M170 Bilateral primary osteoarthritis of knee: Secondary | ICD-10-CM | POA: Diagnosis not present

## 2020-04-30 DIAGNOSIS — M170 Bilateral primary osteoarthritis of knee: Secondary | ICD-10-CM | POA: Diagnosis not present

## 2020-04-30 DIAGNOSIS — M25562 Pain in left knee: Secondary | ICD-10-CM | POA: Diagnosis not present

## 2020-04-30 DIAGNOSIS — M25561 Pain in right knee: Secondary | ICD-10-CM | POA: Diagnosis not present

## 2020-10-28 DIAGNOSIS — I10 Essential (primary) hypertension: Secondary | ICD-10-CM | POA: Diagnosis not present

## 2021-07-30 ENCOUNTER — Other Ambulatory Visit: Payer: Self-pay

## 2021-07-30 NOTE — Patient Outreach (Signed)
Aging Gracefully Program  07/30/2021  Maureen [name] November 16, 1933 [id number]  [facility] Evaluation Interviewer made contact with patient's daughter. Aging Gracefully initial survey scheduled for 08/03/21 @ 10 o'clock.  Baruch Gouty [facility] Management Assistant [phone]

## 2021-08-03 ENCOUNTER — Other Ambulatory Visit: Payer: Self-pay

## 2021-08-03 NOTE — Patient Outreach (Signed)
Aging Gracefully Program  08/03/2021  Maureen [name] 1933-09-04 [id number]  [facility] Evaluation Interviewer made contact with patient. Aging Gracefully initial survey completed.   Interviewer will send referral to Rowe Pavy, RN and OT for follow up.  [facility] Care Management Assistant  [phone]

## 2021-08-12 ENCOUNTER — Other Ambulatory Visit: Payer: Self-pay | Admitting: Occupational Therapy

## 2021-08-12 ENCOUNTER — Other Ambulatory Visit: Payer: Self-pay

## 2021-08-12 NOTE — Patient Instructions (Signed)
Goals Addressed             This Visit's Progress    Patient Stated       She would like to feel more safe and independent in her bathrooms. (grab bars will help  as well as a comfort height toilet)     Patient Stated       She would like to feel safe getting into and out of her entrances at her house and out building (ramp with deck at side door, ramp at out building, and rail at front entrance are recommended).     Patient Stated       She would like to feel more safe getting into and out of her bed with hopefully her current step stool. (Adding a rail/bars to it one on side or other and non skid underneath it v. Finding another step stool that would be more safe)     Patient Stated       Like for it to be easier for her to do buttons and zippers ( a button hook and zipper pull would help)

## 2021-08-12 NOTE — Patient Outreach (Signed)
Aging Gracefully Program  OT Initial Visit  08/12/2021  Maureen [name] 18-Mar-1933 [id number]  Visit:  1- Initial Visit  Start Time:  1400 End Time:  1515 Total Minutes:  75  CCAP: Typical Daily Routine: What Types Of Care Problems Are You Having Throughout The Day?: Getting up and down from surfaces, waking around, getting in and out of the shower, get up and down from the bed What Kind Of Help Do You Receive?: None What Do You Think Would Make Everyday Life Easier For You?: If my knees did not hurt and balance was better. Grab bars in both bathrooms, ramp at side  entrance, handrail at front enterance What Is A Good Day Like?: Less pain in knees What Is A Bad Day Like?: More pain in knees Do You Have Time For Yourself?: yes Patient Reported Equipment: Patient Reported Equipment Currently Used: Raised Engineer, civil (consulting), Rollator, Agricultural consultant, Paediatric nurse, Single [employer] Functional Mobility-Maintain Balance While Showering: Maintaining Balance While Showering: A Little Difficulty Do You:: Use A Device Other Comments:: uses shower seat and hand held shower Functional Mobility-Stooping, Crouching, Kneeling To Retreive Item: Stooping, Crouching, or Kneeling To Retrieve Item: Unable To Do (But can bend over well to reach items on floor as long as she is holding onto a surface with one hand while using the other one to pick the item up.)  Functional Mobility-Move In And Out Of Bed: Move In and Out Of Bed: A Little Difficulty Do You:: Use A Device Importance Of Learning New Strategies:: A Little Observation: Move In and Out Of Bed: Independent With Pain, Difficulty, Or Use Of Device Intervention: Yes Other Comments:: would benfit from a handle or rail on step stool or step stool already with handle. Functional Mobility-Move In And Out Of Bath/Shower: Move In And Out Of A Bath/Shower: A Little Difficulty Do You:: Use A Device Importance Of Learning New Strategies::  Moderate Observation: Move In And Out Of Bath/Shower: Independent With Pain, Difficulty, Or Use Of Device Safety: Moderate/Extreme Risk Efficiency: Somewhat Intervention: Yes Other Comments:: grab bars needed    Activities of Daily Living-Put On And Take Off Shirt/Dress/Coat (Incl. Fasteners): Put On And Take Off Shirt/Dress/Coat (Incl. Fasteners): A Little Difficulty Do You:: No Device/No Assistance Importance Of Learning New Strategies: A Little Other Comments:: difficulty with buttons and zippers Intervention: Yes Other Comments:: zipper pull/button hook   Readiness To Change Score:  Readiness to Change Score: 10  Home Environment Assessment: Outside Home Entry:: Main side level entry---steps at side door with aluminum ramp but does not reach all the way to top step or threshold of door. Front entry steps no rail. Bathroom:: Hall bath needs a grab bar at toilet (has comfort height toilet), tub/shower combination with shower curtain. Master bath has low toilet with toilet riser on it (needs a comfort height toilet with grab bar. Walk in shower wtih sliding doors. Needs  grab bars inside and outside shower. We discussed doors coming down and putting up a rod with shower curtain to give her more room to get in and out easier--she does not want to do this at this time. Master Bedroom:: There is a significant height difference transition strip from bedroom to bathroom--would be beneficial to look at it to see if can make this more level so as to no be a trip hazzard Other Home Environment Concerns:: Leaks stains in ceilings of both bathrooms, cracks in walls from house settlling, unevenness in floors throughout house, a couple of bedroom  doors will not close due to house settling. Exhaust fans in bathrrom are loud. Step at out building without a rail. Screen sliding door at front door does not slide easily.    Goals:  Goals Addressed             This Visit's Progress    Patient  Stated       She would like to feel more safe and independent in her bathrooms. (grab bars will help  as well as a comfort height toilet)     Patient Stated       She would like to feel safe getting into and out of her entrances at her house and out building (ramp with deck at side door, ramp at out building, and rail at front entrance are recommended).     Patient Stated       She would like to feel more safe getting into and out of her bed with hopefully her current step stool. (Adding a rail/bars to it one on side or other and non skid underneath it v. Finding another step stool that would be more safe)     Patient Stated       Like for it to be easier for her to do buttons and zippers ( a button hook and zipper pull would help)        Post Clinical Reasoning: Clinician View Of Client Situation:: Ms. Kolek gets along in the house using 2 single point canes and outside the house with a rollator. Her knees are really bothering her and per her daughter who was present both knees are bone on bone and she is currently taking injections and trying to decide if she will do something more.She does her own basic and household activities of daily living tasks. Client View Of His/Her Situation:: She feels she does pretty well for herself and I absolutely agree. There are some things inside and outside her home that will make her life easier for her. She is also open to trying some out patient physical therapy to work on strengthening her legs. Next Visit Plan:: Potentially a step stool with handle OR a button hook/zipper pull.  Ignacia Palma, OTR/L Aging Gracefully [phone]

## 2021-08-23 ENCOUNTER — Other Ambulatory Visit: Payer: Self-pay

## 2021-08-23 NOTE — Patient Outreach (Signed)
Aging Gracefully Program  08/23/2021  Maureen [name] 08/14/1933 [id number]  Placed call to patient to schedule home visit.  Offered home visit for 08/25/2021 at 1pm. Patient accepted.  Confirmed address.  Rowe Pavy RN, BSN, Careers adviser for [employer] Mobile: [phone]

## 2021-08-25 ENCOUNTER — Other Ambulatory Visit: Payer: Self-pay

## 2021-08-25 NOTE — Patient Outreach (Addendum)
Aging Gracefully Program  RN Visit  08/25/2021  Maureen [name] January 25, 1933 [id number]  Visit:   RN home visit 1  Start Time:   1300 End Time:   1415 Total Minutes:   75  Readiness To Change Score:     Universal RN Interventions: Calendar Distribution: Yes Exercise Review: No Medications: Yes Mood: Yes Pain: Yes PCP Advocacy/Support: No Fall Prevention: Yes Incontinence: Yes Clinician View Of Client Situation: Covid screening negative. Daughter present for home visit. Patient sitting on chair at door.  Ambulating with cane and other times holding onto furniture. Throw rugs and area rugs noted. Kitchen is cramped. Patient is awake and alert. Reports her main issue is her knees.  Reports she is worried about having surgery. Reports she misses her medications at times because she can not remember if she took her medications. Currently taking her medications out of her bottles. Provided a pill box to help patient remember to take her medications. Client View Of His/Her Situation: Reports she has a hard time walking due to pain. Reports she can not do what she wants to do. reports her family lives nearby.  Healthcare Provider Communication: none    Clinician View of Client Situation: Clinician View Of Client Situation: Covid screening negative. Daughter present for home visit. Patient sitting on chair at door.  Ambulating with cane and other times holding onto furniture. Throw rugs and area rugs noted. Kitchen is cramped. Patient is awake and alert. Reports her main issue is her knees.  Reports she is worried about having surgery. Reports she misses her medications at times because she can not remember if she took her medications. Currently taking her medications out of her bottles. Provided a pill box to help patient remember to take her medications. Client's View of His/Her Situation: Client View Of His/Her Situation: Reports she has a hard time walking due to pain. Reports she can not do  what she wants to do. reports her family lives nearby.  Medication Assessment: Do You Have Any Problems Paying For Medications?: No Where Does Client Store Medications?: Kitchen Table Can Client Read Pill Bottles?: No Does Client Use A Pillbox?: No Does Anyone Assist Client In Taking Medications?: No Do You Take Vitamin D?: No Total Number Of Medications That The Client Takes: 4 Does Client Have Any Questions Or Concerns About Medictions?: No Is Client Complaining Of Any Symptoms That Could Be Side Effects To Medications?: No Any Possible Changes In Medication Regimen?: Yes  Outpatient Encounter Medications as of 08/25/2021  Medication Sig   acetaminophen (TYLENOL) 650 MG CR tablet Take 650 mg by mouth every 8 (eight) hours as needed for pain.   amLODipine (NORVASC) 2.5 MG tablet Take 2.5 mg by mouth in the morning and at bedtime.   hydrochlorothiazide (HYDRODIURIL) 25 MG tablet Take 25 mg by mouth daily.   nebivolol (BYSTOLIC) 10 MG tablet Take 10 mg by mouth daily.   lisinopril (ZESTRIL) 10 MG tablet Take 1 tablet (10 mg total) by mouth daily. (Patient not taking: Reported on 08/25/2021)   lisinopril-hydrochlorothiazide (ZESTORETIC) 10-12.5 MG tablet Take 1 tablet by mouth daily.   metoprolol succinate (TOPROL-XL) 50 MG 24 hr tablet Take 50 mg by mouth at bedtime. (Patient not taking: Reported on 08/25/2021)   [DISCONTINUED] amLODipine-valsartan (EXFORGE) 5-320 MG tablet Take 0.5 tablets by mouth 2 (two) times daily.    [DISCONTINUED] atorvastatin (LIPITOR) 20 MG tablet Take 1 tablet (20 mg total) by mouth daily. (Patient not taking: Reported on 11/09/2019)  No facility-administered encounter medications on file as of 08/25/2021.     OT Update: pending home modification.  Emailed to [employer] per request of patients daughter.   Goals Addressed               This Visit's Progress     Patient Stated (pt-stated)        Aging Gracefully RN:  Goal:  Patient will report  decreased pain in her knees in the next 3-4 months.  08/25/2021 Assessment:  Patient describes severe pain in both knees. Right knee is the worse. Left leg swollen. Patient reports no pain with sitting or at rest. Severe pain in the knees when any movement.  Patient reports frustration at not being able to do what she wants to do.  Daughter reports patient was given 2 options for knee pain-  (1) knee replacement, (2) cortisone shots which are not helping. Patient taking tylenol arthritis for pain but one take 1 tablet once a day.   Interventions: Reviewed quality of life in relations to knee pain.  Reviewed with patient that she can take tylenol arthritis every 8 hours and I encouraged patient to do so.  Encouraged patient to follow up with primary MD. Encouraged patient to inquire about other medications for knee pain like anti-inflammatories or other types of pain medications.  Provided 2 pill boxes.  Provided [facility] calendar and recording next home visit.  Reviewed with daughter if someone would be able to assist patient post op when she came home due to patients concern of recovering at home.  Daughter reports that patient has enough children that everyone could pitch in.  Plan: follow up September 26.    Rowe Pavy RN, BSN, CEN RN Case Production designer, theatre/television/film for [employer] [facility] Mobile: [phone]           Session Summary: New patient with severe pain in her knees.  PCP retired and will be getting established with new PCP.   Rowe Pavy RN, BSN, Careers adviser for [employer] Mobile: [phone]

## 2021-09-20 ENCOUNTER — Other Ambulatory Visit: Payer: Self-pay

## 2021-09-20 NOTE — Patient Outreach (Signed)
Aging Gracefully Program  RN Visit  09/20/2021  Maureen [name] [birth year] [id number]  Visit:   RN Home visit #2  Start Time:   1140 End Time:   1215 Total Minutes:   35  Readiness To Change Score:     Universal RN Interventions: Calendar Distribution: Yes Exercise Review: Yes Medications: Yes Medication Changes: No Mood: Yes Pain: Yes PCP Advocacy/Support: No Clinician View Of Client Situation: Covid screening negative.   Home neat and clean. Ambuating with 2 canes.  Feels like it holds herup better. denies falls denies recent MD visitis or medications changes.  no additional thoughts about surgery.  Using medications boxes which is helping.  Using Calendar which is helping. Client View Of His/Her Situation: reports right knee pain today.eating and sleeping well.  no falls. reports she is sewing daily.  Healthcare Provider Communication: Did Surveyor, mining With [employer] Provider?: No According to Client, Did PCP Report Communication With An Aging Gracefully RN?: No  Clinician View of Client Situation: Clinician View Of Client Situation: Covid screening negative.   Home neat and clean. Ambuating with 2 canes.  Feels like it holds herup better. denies falls denies recent MD visitis or medications changes.  no additional thoughts about surgery.  Using medications boxes which is helping.  Using Calendar which is helping. Client's View of His/Her Situation: Client View Of His/Her Situation: reports right knee pain today.eating and sleeping well.  no falls. reports she is sewing daily.  Medication Assessment:denies changes to medications    OT Update: pending [employer] to start modifications. Patient is now using a 2 canes ( one in each hand )   Session Summary: Doing well. More interactive at todays visit . Reports she feels good.  Preparing breakfast when I arrived.    Goals Addressed               This Visit's Progress     Patient Stated  (pt-stated)   On track     Aging Gracefully RN:  Goal:  Patient will report decreased pain in her knees in the next 3-4 months.  08/25/2021 Assessment:  Patient describes severe pain in both knees. Right knee is the worse. Left leg swollen. Patient reports no pain with sitting or at rest. Severe pain in the knees when any movement.  Patient reports frustration at not being able to do what she wants to do.  Daughter reports patient was given 2 options for knee pain-  (1) knee replacement, (2) cortisone shots which are not helping. Patient taking tylenol arthritis for pain but one take 1 tablet once a day.   Interventions: Reviewed quality of life in relations to knee pain.  Reviewed with patient that she can take tylenol arthritis every 8 hours and I encouraged patient to do so.  Encouraged patient to follow up with primary MD. Encouraged patient to inquire about other medications for knee pain like anti-inflammatories or other types of pain medications.  Provided 2 pill boxes.  Provided [facility] calendar and recording next home visit.  Reviewed with daughter if someone would be able to assist patient post op when she came home due to patients concern of recovering at home.  Daughter reports that patient has enough children that everyone could pitch in.  Plan: follow up September 26.    Rowe Pavy RN, BSN, CEN RN Case Production designer, theatre/television/film for [employer] [facility] Mobile: [phone]    09/20/2021   Assessment:  reports pain decreased in the left  knee. Reports the pain is the same in the right knee. Reports she is taking tylenol for pain.  Reports she started using 2 canes about 1 week ago and this has seemed to help.   Reports she does not want to use a walker. Reports she is able to walk to mailbox daily with her canes  Interventions: Reviewed importance of taking tylenol as needed for pain. Reviewed safety when using 2 canes. Provided written exercises and reviewed how to complete  exercises.   Plan.  Follow up in 1 month.  Rowe Pavy RN, BSN, Careers adviser for [employer] Mobile: [phone]        Rowe Pavy RN, BSN, Careers adviser for [employer] Mobile: [phone]

## 2021-09-20 NOTE — Patient Instructions (Signed)
Visit Information  PATIENT GOALS:  Goals Addressed               This Visit's Progress     Patient Stated (pt-stated)   On track     Aging Gracefully RN:  Goal:  Patient will report decreased pain in her knees in the next 3-4 months.  08/25/2021 Assessment:  Patient describes severe pain in both knees. Right knee is the worse. Left leg swollen. Patient reports no pain with sitting or at rest. Severe pain in the knees when any movement.  Patient reports frustration at not being able to do what she wants to do.  Daughter reports patient was given 2 options for knee pain-  (1) knee replacement, (2) cortisone shots which are not helping. Patient taking tylenol arthritis for pain but one take 1 tablet once a day.   Interventions: Reviewed quality of life in relations to knee pain.  Reviewed with patient that she can take tylenol arthritis every 8 hours and I encouraged patient to do so.  Encouraged patient to follow up with primary MD. Encouraged patient to inquire about other medications for knee pain like anti-inflammatories or other types of pain medications.  Provided 2 pill boxes.  Provided [facility] calendar and recording next home visit.  Reviewed with daughter if someone would be able to assist patient post op when she came home due to patients concern of recovering at home.  Daughter reports that patient has enough children that everyone could pitch in.  Plan: follow up September 26.    Rowe Pavy RN, BSN, CEN RN Case Production designer, theatre/television/film for [employer] [facility] Mobile: [phone]    09/20/2021   Assessment:  reports pain decreased in the left knee. Reports the pain is the same in the right knee. Reports she is taking tylenol for pain.  Reports she started using 2 canes about 1 week ago and this has seemed to help.   Reports she does not want to use a walker. Reports she is able to walk to mailbox daily with her canes  Interventions: Reviewed importance of taking tylenol as  needed for pain. Reviewed safety when using 2 canes. Provided written exercises and reviewed how to complete exercises.   Plan.  Follow up in 1 month.  Rowe Pavy RN, BSN, Careers adviser for [employer] Mobile: [phone]         Rowe Pavy RN, BSN, Careers adviser for [employer] Mobile: [phone]

## 2021-10-18 ENCOUNTER — Other Ambulatory Visit: Payer: Self-pay

## 2021-10-18 NOTE — Patient Outreach (Signed)
Aging Gracefully Program  RN Visit  10/18/2021  Maureen [name] 05/17/1933 [id number]  Visit:   RN Home visit #3  Start Time:   1145 End Time:   1230 Total Minutes:   45  Readiness To Change Score:     Universal RN Interventions: Calendar Distribution: Yes Exercise Review: Yes Medications: Yes Medication Changes: Yes Mood: Yes Pain: Yes PCP Advocacy/Support: No Fall Prevention: Yes Incontinence: Yes Clinician View Of Client Situation: Covid negative.   Home neat and clean. Did not hear me knock. No door bell. had to call into the home and patient answered.  Denies any falls. No recent PCP visits.   using 2 canes. Client View Of His/Her Situation: Reports she is using the pill boxes I gave her at the last home visit. She reports this helps.  Healthcare Provider Communication:  none    Diplomatic Services operational officer of Client Situation: Clinician View Of Client Situation: Covid negative.   Home neat and clean. Did not hear me knock. No door bell. had to call into the home and patient answered.  Denies any falls. No recent PCP visits.   using 2 canes. Client's View of His/Her Situation: Client View Of His/Her Situation: Reports she is using the pill boxes I gave her at the last home visit. She reports this helps.  Medication Assessment: no changes    OT Update: pending [facility] housing [facility] assessment.  Session Summary: Doing very well.  Denies any new problems or concerns.Reports her only issue is her knees.    Goals Addressed               This Visit's Progress     Patient Stated (pt-stated)        Aging Gracefully RN:  Goal:  Patient will report decreased pain in her knees in the next 3-4 months.  08/25/2021 Assessment:  Patient describes severe pain in both knees. Right knee is the worse. Left leg swollen. Patient reports no pain with sitting or at rest. Severe pain in the knees when any movement.  Patient reports frustration at not being able to do what she wants to do.   Daughter reports patient was given 2 options for knee pain-  (1) knee replacement, (2) cortisone shots which are not helping. Patient taking tylenol arthritis for pain but one take 1 tablet once a day.   Interventions: Reviewed quality of life in relations to knee pain.  Reviewed with patient that she can take tylenol arthritis every 8 hours and I encouraged patient to do so.  Encouraged patient to follow up with primary MD. Encouraged patient to inquire about other medications for knee pain like anti-inflammatories or other types of pain medications.  Provided 2 pill boxes.  Provided [facility] calendar and recording next home visit.  Reviewed with daughter if someone would be able to assist patient post op when she came home due to patients concern of recovering at home.  Daughter reports that patient has enough children that everyone could pitch in.  Plan: follow up September 26.    Rowe Pavy RN, BSN, CEN RN Case Production designer, theatre/television/film for [employer] [facility] Mobile: [phone]    09/20/2021   Assessment:  reports pain decreased in the left knee. Reports the pain is the same in the right knee. Reports she is taking tylenol for pain.  Reports she started using 2 canes about 1 week ago and this has seemed to help.   Reports she does not want to use a walker.  Reports she is able to walk to mailbox daily with her canes  Interventions: Reviewed importance of taking tylenol as needed for pain. Reviewed safety when using 2 canes. Provided written exercises and reviewed how to complete exercises.   Plan.  Follow up in 1 month.  Rowe Pavy RN, BSN, CEN RN Case Production designer, theatre/television/film for [employer] [facility] Mobile: [phone]   10/24/20022  Assessment: Reports using hot pads when it is cold.  Reports pain is about the same.   Interventions:  Reviewed use of muscle rubs.Encouraged patient to try a pillow between her knees.   Plan: follow up in 1 month.  Rowe Pavy RN, BSN,  Careers adviser for [employer] Mobile: [phone]

## 2021-11-29 ENCOUNTER — Other Ambulatory Visit: Payer: Self-pay

## 2021-11-29 NOTE — Patient Outreach (Deleted)
Aging Gracefully Program  RN Visit  11/29/2021  NICHELE SLAWSON 1933-04-15 [id number]  Visit:     Start Time:    End Time:    Total Minutes:     Readiness To Change Score:     Universal RN Interventions: Calendar Distribution: Yes Exercise Review: Yes Medications: Yes Medication Changes: Yes Mood: Yes Pain: Yes PCP Advocacy/Support: No Fall Prevention: Yes Incontinence: Yes Clinician View Of Client Situation: Covid  screening negative.  When arrived patient was outside with walker.  Ambulated up ramp with walker and then used cane. Client View Of His/Her Situation: reports she saw a new PCP. Went to [employer] for a week.  Reports she had a great time.  Saw family. No recent falls. reports she continues to have pain in knees.  Healthcare Provider Communication:    Diplomatic Services operational officer of Client Situation: Clinician View Of Client Situation: Covid  screening negative.  When arrived patient was outside with walker.  Ambulated up ramp with walker and then used cane. Client's View of His/Her Situation: Client View Of His/Her Situation: reports she saw a new PCP. Went to [employer] for a week.  Reports she had a great time.  Saw family. No recent falls. reports she continues to have pain in knees.  Medication Assessment:    OT Update: ***  Session Summary: ***

## 2021-11-30 NOTE — Patient Outreach (Deleted)
Aging Gracefully Program  RN Visit  11/30/2021  Maureen [name] 26-Jan-1933 [id number]  Visit:   RN Home visit #4  Start Time:   1251 End Time:   1315 Total Minutes:   26  Readiness To Change Score:     Universal RN Interventions: Calendar Distribution: Yes Exercise Review: Yes Medications: Yes Medication Changes: Yes Mood: Yes Pain: Yes PCP Advocacy/Support: No Fall Prevention: Yes Incontinence: Yes Clinician View Of Client Situation: Covid  screening negative.  When arrived patient was outside with walker.  Ambulated up ramp with walker and then used cane. Client View Of His/Her Situation: reports she saw a new PCP. Went to [employer] for a week.  Reports she had a great time.  Saw family. No recent falls. reports she continues to have pain in knees.  Healthcare Provider Communication: Did Surveyor, mining With [employer] Provider?: No According to Client, Did PCP Report Communication With An Aging Gracefully RN?: No  Clinician View of Client Situation: Clinician View Of Client Situation: Covid  screening negative.  When arrived patient was outside with walker.  Ambulated up ramp with walker and then used cane. Client's View of His/Her Situation: Client View Of His/Her Situation: reports she saw a new PCP. Went to [employer] for a week.  Reports she had a great time.  Saw family. No recent falls. reports she continues to have pain in knees.  Medication Assessment: Denies any changes to medications    OT Update: pending community housing solutions assessment. Email sent to [employer] to inquire.   Goals Addressed               This Visit's Progress     COMPLETED: Patient Stated (pt-stated)        Aging Gracefully RN:  Goal:  Patient will report decreased pain in her knees in the next 3-4 months.  08/25/2021 Assessment:  Patient describes severe pain in both knees. Right knee is the worse. Left leg swollen. Patient reports no pain with sitting or at rest. Severe pain  in the knees when any movement.  Patient reports frustration at not being able to do what she wants to do.  Daughter reports patient was given 2 options for knee pain-  (1) knee replacement, (2) cortisone shots which are not helping. Patient taking tylenol arthritis for pain but one take 1 tablet once a day.   Interventions: Reviewed quality of life in relations to knee pain.  Reviewed with patient that she can take tylenol arthritis every 8 hours and I encouraged patient to do so.  Encouraged patient to follow up with primary MD. Encouraged patient to inquire about other medications for knee pain like anti-inflammatories or other types of pain medications.  Provided 2 pill boxes.  Provided [facility] calendar and recording next home visit.  Reviewed with daughter if someone would be able to assist patient post op when she came home due to patients concern of recovering at home.  Daughter reports that patient has enough children that everyone could pitch in.  Plan: follow up September 26.    Rowe Pavy RN, BSN, CEN RN Case Production designer, theatre/television/film for [employer] [facility] Mobile: [phone]    09/20/2021   Assessment:  reports pain decreased in the left knee. Reports the pain is the same in the right knee. Reports she is taking tylenol for pain.  Reports she started using 2 canes about 1 week ago and this has seemed to help.   Reports she does not want  to use a walker. Reports she is able to walk to mailbox daily with her canes  Interventions: Reviewed importance of taking tylenol as needed for pain. Reviewed safety when using 2 canes. Provided written exercises and reviewed how to complete exercises.   Plan.  Follow up in 1 month.  Rowe Pavy RN, BSN, CEN RN Case Production designer, theatre/television/film for [employer] [facility] Mobile: [phone]   10/24/20022  Assessment: Reports using hot pads when it is cold.  Reports pain is about the same.   Interventions:  Reviewed use of muscle  rubs.Encouraged patient to try a pillow between her knees.   Plan: follow up in 1 month.  Rowe Pavy RN, BSN, CEN RN Case Production designer, theatre/television/film for [employer] [facility] Mobile: [phone]   11/29/2021 Assessment:  Reports pain decreased in knees. Using Voltaren gel and doing home exercises. Take tylenol as needed.  Reviewed her pain with her new PCP and patient is considering surgery but just not sure of what she wants to do.  PLAN: goal completed. Encouraged patient to continue home exercise plan.  Rowe Pavy RN, BSN, CEN RN Case Production designer, theatre/television/film for [employer] [facility] Mobile: [phone]          Session Summary: Patient doing very well. Was outside with her walker when I arrived.  Nursing goals completed.  Rowe Pavy RN, BSN, Careers adviser for [employer] Mobile: [phone]

## 2021-11-30 NOTE — Patient Outreach (Addendum)
Aging Gracefully Program  RN Visit  12/01/2021  Maureen [name] 06/15/1933 [id number]  Visit:  RN Home visit #4  Start Time:  1251 End Time:  1315 Total Minutes:  26  Readiness To Change Score:     Universal RN Interventions: Calendar Distribution: Yes Exercise Review: Yes Medications: Yes Medication Changes: Yes Mood: Yes Pain: Yes PCP Advocacy/Support: No Fall Prevention: Yes Incontinence: Yes Clinician View Of Client Situation: Covid  screening negative.  When arrived patient was outside with walker.  Ambulated up ramp with walker and then used cane. Client View Of His/Her Situation: reports she saw a new PCP. Went to [employer] for a week.  Reports she had a great time.  Saw family. No recent falls. reports she continues to have pain in knees.  Healthcare Provider Communication: Did Surveyor, mining With [employer] Provider?: No According to Client, Did PCP Report Communication With An Aging Gracefully RN?: No  Clinician View of Client Situation: Clinician View Of Client Situation: Covid  screening negative.  When arrived patient was outside with walker.  Ambulated up ramp with walker and then used cane. Client's View of His/Her Situation: Client View Of His/Her Situation: reports she saw a new PCP. Went to [employer] for a week.  Reports she had a great time.  Saw family. No recent falls. reports she continues to have pain in knees.  Medication Assessment: Denies any changes to medications    OT Update: pending community housing solutions assessment. Email sent to [employer] to inquire.   Goals Addressed               This Visit's Progress     COMPLETED: Patient Stated (pt-stated)        Aging Gracefully RN:  Goal:  Patient will report decreased pain in her knees in the next 3-4 months.  08/25/2021 Assessment:  Patient describes severe pain in both knees. Right knee is the worse. Left leg swollen. Patient reports no pain with sitting or at rest. Severe pain in  the knees when any movement.  Patient reports frustration at not being able to do what she wants to do.  Daughter reports patient was given 2 options for knee pain-  (1) knee replacement, (2) cortisone shots which are not helping. Patient taking tylenol arthritis for pain but one take 1 tablet once a day.   Interventions: Reviewed quality of life in relations to knee pain.  Reviewed with patient that she can take tylenol arthritis every 8 hours and I encouraged patient to do so.  Encouraged patient to follow up with primary MD. Encouraged patient to inquire about other medications for knee pain like anti-inflammatories or other types of pain medications.  Provided 2 pill boxes.  Provided [facility] calendar and recording next home visit.  Reviewed with daughter if someone would be able to assist patient post op when she came home due to patients concern of recovering at home.  Daughter reports that patient has enough children that everyone could pitch in.  Plan: follow up September 26.    Rowe Pavy RN, BSN, CEN RN Case Production designer, theatre/television/film for [employer] [facility] Mobile: [phone]    09/20/2021   Assessment:  reports pain decreased in the left knee. Reports the pain is the same in the right knee. Reports she is taking tylenol for pain.  Reports she started using 2 canes about 1 week ago and this has seemed to help.   Reports she does not want to use a walker.  Reports she is able to walk to mailbox daily with her canes  Interventions: Reviewed importance of taking tylenol as needed for pain. Reviewed safety when using 2 canes. Provided written exercises and reviewed how to complete exercises.   Plan.  Follow up in 1 month.  Rowe Pavy RN, BSN, CEN RN Case Production designer, theatre/television/film for [employer] [facility] Mobile: [phone]   10/24/20022  Assessment: Reports using hot pads when it is cold.  Reports pain is about the same.   Interventions:  Reviewed use of muscle  rubs.Encouraged patient to try a pillow between her knees.   Plan: follow up in 1 month.  Rowe Pavy RN, BSN, CEN RN Case Production designer, theatre/television/film for [employer] [facility] Mobile: [phone]   11/29/2021 Assessment:  Reports pain decreased in knees. Using Voltaren gel and doing home exercises. Take tylenol as needed.  Reviewed her pain with her new PCP and patient is considering surgery but just not sure of what she wants to do.  PLAN: goal completed. Encouraged patient to continue home exercise plan.  Rowe Pavy RN, BSN, CEN RN Case Production designer, theatre/television/film for [employer] [facility] Mobile: [phone]          Session Summary: Patient doing very well. Was outside with her walker when I arrived.  Nursing goals completed.  Rowe Pavy RN, BSN, Careers adviser for [employer] Mobile: [phone]

## 2021-11-30 NOTE — Patient Instructions (Addendum)
Goals Addressed               This Visit's Progress     COMPLETED: Patient Stated (pt-stated)        Aging Gracefully RN:  Goal:  Patient will report decreased pain in her knees in the next 3-4 months.  08/25/2021 Assessment:  Patient describes severe pain in both knees. Right knee is the worse. Left leg swollen. Patient reports no pain with sitting or at rest. Severe pain in the knees when any movement.  Patient reports frustration at not being able to do what she wants to do.  Daughter reports patient was given 2 options for knee pain-  (1) knee replacement, (2) cortisone shots which are not helping. Patient taking tylenol arthritis for pain but one take 1 tablet once a day.   Interventions: Reviewed quality of life in relations to knee pain.  Reviewed with patient that she can take tylenol arthritis every 8 hours and I encouraged patient to do so.  Encouraged patient to follow up with primary MD. Encouraged patient to inquire about other medications for knee pain like anti-inflammatories or other types of pain medications.  Provided 2 pill boxes.  Provided [facility] calendar and recording next home visit.  Reviewed with daughter if someone would be able to assist patient post op when she came home due to patients concern of recovering at home.  Daughter reports that patient has enough children that everyone could pitch in.  Plan: follow up September 26.    Rowe Pavy RN, BSN, CEN RN Case Production designer, theatre/television/film for [employer] [facility] Mobile: [phone]    09/20/2021   Assessment:  reports pain decreased in the left knee. Reports the pain is the same in the right knee. Reports she is taking tylenol for pain.  Reports she started using 2 canes about 1 week ago and this has seemed to help.   Reports she does not want to use a walker. Reports she is able to walk to mailbox daily with her canes  Interventions: Reviewed importance of taking tylenol as needed for pain. Reviewed safety  when using 2 canes. Provided written exercises and reviewed how to complete exercises.   Plan.  Follow up in 1 month.  Rowe Pavy RN, BSN, CEN RN Case Production designer, theatre/television/film for [employer] [facility] Mobile: [phone]   10/24/20022  Assessment: Reports using hot pads when it is cold.  Reports pain is about the same.   Interventions:  Reviewed use of muscle rubs.Encouraged patient to try a pillow between her knees.   Plan: follow up in 1 month.  Rowe Pavy RN, BSN, CEN RN Case Production designer, theatre/television/film for [employer] [facility] Mobile: [phone]   11/29/2021 Assessment:  Reports pain decreased in knees. Using Voltaren gel and doing home exercises. Take tylenol as needed.  Reviewed her pain with her new PCP and patient is considering surgery but just not sure of what she wants to do.  PLAN: goal completed. Encouraged patient to continue home exercise plan.  Rowe Pavy RN, BSN, Careers adviser for [employer] Mobile: [phone]

## 2022-01-13 ENCOUNTER — Other Ambulatory Visit: Payer: Self-pay

## 2022-01-13 ENCOUNTER — Other Ambulatory Visit: Payer: Self-pay | Admitting: Occupational Therapy

## 2022-01-13 NOTE — Patient Instructions (Signed)
Like for it to be easier for her to do buttons and zippers ( a button hook and zipper pull would help)   ACTION PLANNING - DRESSING Target Problem Area: Difficult time with buttons and sippers  Why Problem May Occur: Decreased dexterity in hands  Target Goal: Ease with buttons and zippers   STRATEGIES Saving Your Energy: DO: DON'T:  Sit down to do tasks  Stand too long while dressing  Use appropriate adaptive equipment--buttonhook   Keep all items you'll need within easy reach    Practice It is important to practice the strategies so we can determine if they will be effective in helping to reach your goal. Follow these specific recommendations: 1. Try to use a buttonhook and zipper pull for all buttons and zippers to get use to using it.  Ignacia Palma       01/13/2022

## 2022-01-13 NOTE — Patient Outreach (Signed)
Aging Gracefully Program  OT Follow-Up Visit  01/13/2022  DOREENA MAULDEN March 23, 1933 [id number]  Visit:  2- Second Visit  Start Time:  4097 End Time:  1045 Total Minutes:  50   Readiness to Change Score :  Readiness to Change Score: 10  Durable Medical Equipment: Adaptive Equipment: Other (button hook and zipper pull) Adaptive Equipment Distribution Date: 01/13/22  Patient Education: Education Provided: Yes Education Details: How to get up from a fall Person(s) Educated: Patient Comprehension: Verbalized Understanding, Verbal Cues Required, Returned Demonstration  Goals:   Goals Addressed             This Visit's Progress    COMPLETED: Patient Stated       Like for it to be easier for her to do buttons and zippers ( a button hook and zipper pull would help)   ACTION PLANNING - DRESSING Target Problem Area: Difficult time with buttons and sippers  Why Problem May Occur: Decreased dexterity in hands  Target Goal: Ease with buttons and zippers   STRATEGIES Saving Your Energy: DO: DON'T:  Sit down to do tasks  Stand too long while dressing  Use appropriate adaptive equipment--buttonhook   Keep all items you'll need within easy reach   Practice It is important to practice the strategies so we can determine if they will be effective in helping to reach your goal. Follow these specific recommendations: 1. Try to use a buttonhook and zipper pull for all buttons and zippers to get use to using it.  Ignacia Palma       01/13/2022        Post Clinical Reasoning: Client Action (Goal) Four Interventions: Pt given a buttom hook/zipper pull Did Client Try?: Yes Targeted Problem Area Status: A Lot Better Clinician View Of Client Situation:: Ms. Fabio was able to use the button hook to hook 1/2 round buttons on a sweater she has and the zipper pull to start the zipper on her coat. She was encouraged to continue to use it to get use to using it. Client View Of His/Her  Situation:: Mrs. Miles is getting around fairily well as long as he has a cane to use or holds onto furniture. She reports no pain at rest, but does have pain when she gets up and moves. Next Visit Plan:: Will bring a step stool with handle for her to try for getting into her bed.   Ignacia Palma, OTR/L Aging Gracefully [phone]

## 2022-03-04 ENCOUNTER — Other Ambulatory Visit: Payer: Self-pay | Admitting: Occupational Therapy

## 2022-03-05 ENCOUNTER — Other Ambulatory Visit: Payer: Self-pay

## 2022-03-05 NOTE — Patient Outreach (Signed)
Aging Gracefully Program ? ?OT Follow-Up Visit ? ?03/05/2022 ? ?Maureen [name] ?05/13/1933 ?[id number] ? ?Visit:  3- Third Visit ? ?Start Time:  1630 ?End Time:  [id number] ?Total Minutes:  60 ? ? ?Readiness to Change Score :  Readiness to Change Score: 10 ? ? ?Durable Medical Equipment: ?Durable Medical Equipment: Other (step stool with handle) ?Durable Medical Equipment Distribution Date: 03/04/22 ? ? ? ?Goals:  ? Goals Addressed   ? ?  ?  ?  ?  ? This Visit's Progress  ?  COMPLETED: Patient Stated     ?  She would like to feel more safe getting into and out of her bed with hopefully her current step stool. (Adding a rail/bars to it one on side or other and non skid underneath it v. Finding another step stool that would be more safe) ? ?ACTION PLANNING - FUNCTIONAL MOBILITY ?Target Problem Area: ?Safety getting in and out of bed  ?Why Problem May Occur: ?Bed is high, has two step step-stool but not a rail  ?Target Goal: ?Safety with getting in and out of bed  ?STRATEGIES ?Modifying your home environment and making it safe: ?DO: DON'T:  ?Use step stool to get in and our of bed Hold onto unsafe surfaces (anything that will move)  ?Have uncluttered paths   ?Provide adequate lighting Use dim lights or lights that cast a lot of shadows  ?Practice ?It is important to practice the strategies so we can determine if they will be effective in helping to reach your goal. ?Follow these specific recommendations: ?Always have at least one hand on the handle of the step stool when stepping up or down from it. ? ?If a strategy does not work the first time, try it again and again (and maybe again). ?We may make some changes over the next few sessions, based on how they work. ? ? ?Golden Circle, OTR/L      03/04/2022 ?  ? ?  ? ? ?Post Clinical Reasoning: ?Client Action (Goal) Three Interventions: Provided client with heavy duty step stool with handle ?Did Client Try?: Yes ?Targeted Problem Area Status: A Lot Better ?Clinician View Of Client  Situation:: [name] is doing well, getting around inside her house short distances without single point cane and longer distances wtih single point cane. She tried out her new step stool with handle and did well with it. ?Client View Of His/Her Situation:: [name] likes that she able to do a number of things by herself and happy that she has family that can assist when she does need help. She was happy with the step stool with handle provided to her and practiced several times getting in and OOB with it. She is concerned about some dental work she needs done. I will reach out to [place] [place]) to see if she has any resources for her. ?Next Visit Plan:: Assess how she is doing with home modifications once they are completed. ? ?Golden Circle, OTR/L ?Aging Gracefully ?[phone] ?

## 2022-03-05 NOTE — Patient Instructions (Signed)
She would like to feel more safe getting into and out of her bed with hopefully her current step stool. (Adding a rail/bars to it one on side or other and non skid underneath it v. Finding another step stool that would be more safe) ? ?ACTION PLANNING - FUNCTIONAL MOBILITY ?Target Problem Area: ?Safety getting in and out of bed  ?Why Problem May Occur: ?Bed is high, has two step step-stool but not a rail  ?Target Goal: ?Safety with getting in and out of bed  ? ?STRATEGIES ?Modifying your home environment and making it safe: ?DO: DON'T:  ?Use step stool to get in and our of bed Hold onto unsafe surfaces (anything that will move)  ?Have uncluttered paths   ?Provide adequate lighting Use dim lights or lights that cast a lot of shadows  ? ?Practice ?It is important to practice the strategies so we can determine if they will be effective in helping to reach your goal. ?Follow these specific recommendations: ?Always have at least one hand on the handle of the step stool when stepping up or down from it. ? ?If a strategy does not work the first time, try it again and again (and maybe again). ?We may make some changes over the next few sessions, based on how they work. ? ? ?Golden Circle, OTR/L      03/04/2022 ?

## 2022-07-26 ENCOUNTER — Encounter: Payer: Self-pay | Admitting: Family

## 2022-07-26 ENCOUNTER — Encounter (HOSPITAL_COMMUNITY): Payer: Self-pay

## 2022-07-26 ENCOUNTER — Emergency Department (HOSPITAL_COMMUNITY): Payer: Medicare Other

## 2022-07-26 ENCOUNTER — Other Ambulatory Visit: Payer: Self-pay

## 2022-07-26 ENCOUNTER — Emergency Department (HOSPITAL_COMMUNITY)
Admission: EM | Admit: 2022-07-26 | Discharge: 2022-07-26 | Disposition: A | Discharge status: Home / Self Care | Payer: Medicare Other | Attending: Emergency Medicine | Admitting: Emergency Medicine

## 2022-07-26 DIAGNOSIS — I10 Essential (primary) hypertension: Secondary | ICD-10-CM | POA: Insufficient documentation

## 2022-07-26 DIAGNOSIS — Z23 Encounter for immunization: Secondary | ICD-10-CM | POA: Insufficient documentation

## 2022-07-26 DIAGNOSIS — S022XXA Fracture of nasal bones, initial encounter for closed fracture: Secondary | ICD-10-CM

## 2022-07-26 DIAGNOSIS — M47812 Spondylosis without myelopathy or radiculopathy, cervical region: Secondary | ICD-10-CM | POA: Insufficient documentation

## 2022-07-26 DIAGNOSIS — M25561 Pain in right knee: Secondary | ICD-10-CM | POA: Insufficient documentation

## 2022-07-26 DIAGNOSIS — S0031XA Abrasion of nose, initial encounter: Secondary | ICD-10-CM | POA: Insufficient documentation

## 2022-07-26 DIAGNOSIS — W1839XA Other fall on same level, initial encounter: Secondary | ICD-10-CM | POA: Insufficient documentation

## 2022-07-26 DIAGNOSIS — R220 Localized swelling, mass and lump, head: Secondary | ICD-10-CM | POA: Insufficient documentation

## 2022-07-26 DIAGNOSIS — Z79899 Other long term (current) drug therapy: Secondary | ICD-10-CM | POA: Diagnosis not present

## 2022-07-26 DIAGNOSIS — S0992XA Unspecified injury of nose, initial encounter: Secondary | ICD-10-CM | POA: Diagnosis present

## 2022-07-26 LAB — URINALYSIS, ROUTINE W REFLEX MICROSCOPIC
Bacteria, UA: NONE SEEN
Bilirubin Urine: NEGATIVE
Glucose, UA: NEGATIVE mg/dL
Ketones, ur: NEGATIVE mg/dL
Leukocytes,Ua: NEGATIVE
Nitrite: NEGATIVE
Protein, ur: NEGATIVE mg/dL
Specific Gravity, Urine: 1.006 (ref 1.005–1.030)
pH: 7 (ref 5.0–8.0)

## 2022-07-26 LAB — COMPREHENSIVE METABOLIC PANEL
ALT: 27 U/L (ref 0–44)
AST: 35 U/L (ref 15–41)
Albumin: 3.9 g/dL (ref 3.5–5.0)
Alkaline Phosphatase: 56 U/L (ref 38–126)
Anion gap: 11 (ref 5–15)
BUN: 20 mg/dL (ref 8–23)
CO2: 21 mmol/L — ABNORMAL LOW (ref 22–32)
Calcium: 9.6 mg/dL (ref 8.9–10.3)
Chloride: 105 mmol/L (ref 98–111)
Creatinine, Ser: 0.95 mg/dL (ref 0.44–1.00)
GFR, Estimated: 58 mL/min — ABNORMAL LOW (ref >60)
Glucose, Bld: 116 mg/dL — ABNORMAL HIGH (ref 70–99)
Potassium: 4.2 mmol/L (ref 3.5–5.1)
Sodium: 137 mmol/L (ref 135–145)
Total Bilirubin: 0.8 mg/dL (ref 0.3–1.2)
Total Protein: 7.6 g/dL (ref 6.5–8.1)

## 2022-07-26 LAB — CBC WITH DIFFERENTIAL/PLATELET
Abs Immature Granulocytes: 0.03 10*3/uL (ref 0.00–0.07)
Basophils Absolute: 0 10*3/uL (ref 0.0–0.1)
Basophils Relative: 0 %
Eosinophils Absolute: 0.1 10*3/uL (ref 0.0–0.5)
Eosinophils Relative: 1 %
HCT: 46 % (ref 36.0–46.0)
Hemoglobin: 14.5 g/dL (ref 12.0–15.0)
Immature Granulocytes: 0 %
Lymphocytes Relative: 22 %
Lymphs Abs: 2 10*3/uL (ref 0.7–4.0)
MCH: 25.9 pg — ABNORMAL LOW (ref 26.0–34.0)
MCHC: 31.5 g/dL (ref 30.0–36.0)
MCV: 82.1 fL (ref 80.0–100.0)
Monocytes Absolute: 0.7 10*3/uL (ref 0.1–1.0)
Monocytes Relative: 8 %
Neutro Abs: 6.2 10*3/uL (ref 1.7–7.7)
Neutrophils Relative %: 69 %
Platelets: 209 10*3/uL (ref 150–400)
RBC: 5.6 MIL/uL — ABNORMAL HIGH (ref 3.87–5.11)
RDW: 16.4 % — ABNORMAL HIGH (ref 11.5–15.5)
WBC: 9.1 10*3/uL (ref 4.0–10.5)
nRBC: 0 % (ref 0.0–0.2)

## 2022-07-26 MED ORDER — OXYCODONE-ACETAMINOPHEN 5-325 MG PO TABS: 1.0000 | ORAL_TABLET | Freq: Four times a day (QID) | ORAL | 0 refills | Status: AC | PRN

## 2022-07-26 MED ORDER — TETANUS-DIPHTH-ACELL PERTUSSIS 5-2.5-18.5 LF-MCG/0.5 IM SUSY
0.5000 mL | PREFILLED_SYRINGE | Freq: Once | INTRAMUSCULAR | Status: AC
  Administered 2022-07-26: 0.5 mL via INTRAMUSCULAR
  Filled 2022-07-26: qty 0.5

## 2022-07-26 MED ORDER — AMOXICILLIN-POT CLAVULANATE 875-125 MG PO TABS: 1.0000 | ORAL_TABLET | Freq: Two times a day (BID) | ORAL | 0 refills | Status: AC

## 2022-07-26 MED ORDER — AMLODIPINE BESYLATE 5 MG PO TABS
5.0000 mg | ORAL_TABLET | Freq: Once | ORAL | Status: AC
  Administered 2022-07-26: 5 mg via ORAL
  Filled 2022-07-26: qty 1

## 2022-07-26 MED ORDER — AMLODIPINE BESYLATE 5 MG PO TABS: 5.0000 mg | ORAL_TABLET | Freq: Every day | ORAL | 0 refills | Status: AC

## 2022-07-26 MED ORDER — AMOXICILLIN-POT CLAVULANATE 875-125 MG PO TABS
1.0000 | ORAL_TABLET | Freq: Once | ORAL | Status: AC
  Administered 2022-07-26: 1 via ORAL
  Filled 2022-07-26: qty 1

## 2022-07-26 MED ORDER — OXYCODONE-ACETAMINOPHEN 5-325 MG PO TABS
1.0000 | ORAL_TABLET | Freq: Once | ORAL | Status: AC
  Administered 2022-07-26: 1 via ORAL
  Filled 2022-07-26: qty 1

## 2022-07-26 NOTE — Discharge Instructions (Addendum)
I recommend increasing your Norvasc to 5 mg daily and continue your other blood pressure medicines  You may take Tylenol or Motrin for pain.  I have prescribed Percocet for severe pain and Augmentin to help prevent infection  You are expect to have swelling of your nose so please apply some ice on it.  You need to call ENT in 1 to 2 weeks for follow-up.  You may need cosmetic surgery if your nose appears to be crooked or you have trouble breathing   Return to ER if you have severe headaches, trouble breathing, uncontrolled nosebleed

## 2022-07-26 NOTE — ED Provider Notes (Signed)
[facility] EMERGENCY DEPARTMENT Provider Note   CSN: [id number] Arrival date & time: 07/26/22  1134     History  Chief Complaint  Patient presents with   Maureen [name] is a 88 y.o. female history of hypertension, here presenting with fall.  Patient states that her leg gave out and she slipped and fell and landed on the cement face first.  Patient denies passing out.  Patient is not on blood thinners.  Patient complains of right knee pain and also pain of her nose.  Denies any chest pain.   The history is provided by the patient.       Home Medications Prior to Admission medications   Medication Sig Start Date End Date Taking? Authorizing Provider  acetaminophen (TYLENOL) 650 MG CR tablet Take 650 mg by mouth every 8 (eight) hours as needed for pain.    [provider]  amLODipine (NORVASC) 2.5 MG tablet Take 2.5 mg by mouth in the morning and at bedtime.    [provider]  hydrochlorothiazide (HYDRODIURIL) 25 MG tablet Take 25 mg by mouth daily.    [provider]  lisinopril (ZESTRIL) 10 MG tablet Take 1 tablet (10 mg total) by mouth daily. Patient not taking: Reported on 08/25/2021 11/10/19 12/10/19  Couture, Cortni S, PA-C  lisinopril-hydrochlorothiazide (ZESTORETIC) 10-12.5 MG tablet Take 1 tablet by mouth daily. 11/09/19 12/09/19  McDonald, Mia A, PA-C  metoprolol succinate (TOPROL-XL) 50 MG 24 hr tablet Take 50 mg by mouth at bedtime. 11/05/19   [provider]  nebivolol (BYSTOLIC) 10 MG tablet Take 10 mg by mouth daily.    [provider]  amLODipine-valsartan (EXFORGE) 5-320 MG tablet Take 0.5 tablets by mouth 2 (two) times daily.  11/05/19 11/09/19  [provider]  atorvastatin (LIPITOR) 20 MG tablet Take 1 tablet (20 mg total) by mouth daily. Patient not taking: Reported on 11/09/2019 10/05/15 11/09/19  Mack Hook, MD      Allergies    Patient has no known allergies.    Review of  Systems   Review of Systems  Musculoskeletal:        R knee pain   Skin:  Positive for wound.  All other systems reviewed and are negative.   Physical Exam Updated Vital Signs BP (!) 202/73 (BP Location: Left Arm)   Pulse 65   Temp 97.8 F (36.6 C) (Oral)   Resp 17   Ht 5\' 3"  (1.6 m)   Wt 70.3 kg   SpO2 98%   BMI 27.46 kg/m  Physical Exam Vitals and nursing note reviewed.  HENT:     Head: Normocephalic.     Nose:     Comments: + swelling of the nasal bridge. Abrasion of the tip of the nose. Dry blood bilateral nostrils with septal deviation but no obvious septal hematoma    Mouth/Throat:     Mouth: Mucous membranes are moist.  Eyes:     Extraocular Movements: Extraocular movements intact.     Pupils: Pupils are equal, round, and reactive to light.  Cardiovascular:     Rate and Rhythm: Normal rate and regular rhythm.     Pulses: Normal pulses.     Heart sounds: Normal heart sounds.  Pulmonary:     Effort: Pulmonary effort is normal.     Breath sounds: Normal breath sounds.  Abdominal:     General: Abdomen is flat.     Palpations: Abdomen is soft.  Musculoskeletal:  General: Normal range of motion.     Cervical back: Normal range of motion and neck supple.  Skin:    General: Skin is warm.     Capillary Refill: Capillary refill takes less than 2 seconds.  Neurological:     General: No focal deficit present.     Mental Status: She is alert and oriented to person, place, and time.     Cranial Nerves: No cranial nerve deficit.     Sensory: No sensory deficit.     Motor: No weakness.     Coordination: Coordination normal.  Psychiatric:        Mood and Affect: Mood normal.        Behavior: Behavior normal.     ED Results / Procedures / Treatments   Labs (all labs ordered are listed, but only abnormal results are displayed) Labs Reviewed  COMPREHENSIVE METABOLIC PANEL - Abnormal; Notable for the following components:      Result Value   CO2 21 (*)     Glucose, Bld 116 (*)    GFR, Estimated 58 (*)    All other components within normal limits  CBC WITH DIFFERENTIAL/PLATELET - Abnormal; Notable for the following components:   RBC 5.60 (*)    MCH 25.9 (*)    RDW 16.4 (*)    All other components within normal limits  URINALYSIS, ROUTINE W REFLEX MICROSCOPIC - Abnormal; Notable for the following components:   Color, Urine STRAW (*)    Hgb urine dipstick SMALL (*)    All other components within normal limits    EKG None  Radiology CT Head Wo Contrast  Result Date: 07/26/2022 CLINICAL DATA:  Fall, facial trauma. EXAM: CT HEAD WITHOUT CONTRAST CT MAXILLOFACIAL WITHOUT CONTRAST CT CERVICAL SPINE WITHOUT CONTRAST TECHNIQUE: Multidetector CT imaging of the head, cervical spine, and maxillofacial structures were performed using the standard protocol without intravenous contrast. Multiplanar CT image reconstructions of the cervical spine and maxillofacial structures were also generated. RADIATION DOSE REDUCTION: This exam was performed according to the departmental dose-optimization program which includes automated exposure control, adjustment of the mA and/or kV according to patient size and/or use of iterative reconstruction technique. COMPARISON:  None Available. FINDINGS: CT HEAD FINDINGS Brain: Scattered hypodense subcortical and periventricular white matter lesions nonspecific but likely reflecting sequela of chronic microvascular ischemic white matter disease. Lacunar type infarcts in the right basal ganglia. No evidence of acute large vascular territory infarction, hemorrhage, hydrocephalus, extra-axial collection or mass lesion/mass effect. Vascular: No hyperdense vessel. Atherosclerotic calcifications of the internal carotid and vertebral arteries at the skull base. Skull: Calvarium is intact. Other: Mastoid air cells are predominantly clear. CT MAXILLOFACIAL FINDINGS Osseous: Impacted leftward deviated fracture of the nasal bones with overlying  soft tissue swelling and gas. Deformity of the nasal septum with cortical step-off anteriorly for instance on image 6/4 and a probable septal hematoma. No mandibular dislocation. No destructive process. Orbits: Orbits are grossly unremarkable. Sinuses: Visualized portions of the paranasal sinuses are predominantly clear. Soft tissues: Soft tissue swelling with subcutaneous gas overlying the nares. Soft tissue swelling overlying the right frontal bone. CT CERVICAL SPINE FINDINGS Alignment: Straightening of the normal cervical lordosis. No evidence of traumatic listhesis. Skull base and vertebrae: No acute fracture. No primary bone lesion or focal pathologic process. Soft tissues and spinal canal: Partially mineralized pannus posterior to the dens. No prevertebral fluid or swelling. No visible canal hematoma. Disc levels: Advanced multilevel degenerative changes spine with disc space narrowing, osteophyte formation, and uncovertebral/facet  hypertrophy which produce multilevel neural foraminal impingement and spinal canal narrowing. Upper chest: Negative. Other: Carotid artery calcifications. IMPRESSION: 1. Impacted leftward deviated multipart fracture of the nasal bones with overlying soft tissue swelling and gas. 2. Deformity of the nasal septum with cortical step-off anteriorly and a probable septal hematoma. 3. Soft tissue swelling overlying the right frontal bone. 4. No acute intracranial abnormality. 5. No evidence of traumatic listhesis or acute fracture of the cervical spine. 6. Advanced multilevel degenerative changes spine. Electronically Signed   By: Maudry Mayhew M.D.   On: 07/26/2022 12:49   CT Maxillofacial WO CM  Result Date: 07/26/2022 CLINICAL DATA:  Fall, facial trauma. EXAM: CT HEAD WITHOUT CONTRAST CT MAXILLOFACIAL WITHOUT CONTRAST CT CERVICAL SPINE WITHOUT CONTRAST TECHNIQUE: Multidetector CT imaging of the head, cervical spine, and maxillofacial structures were performed using the standard  protocol without intravenous contrast. Multiplanar CT image reconstructions of the cervical spine and maxillofacial structures were also generated. RADIATION DOSE REDUCTION: This exam was performed according to the departmental dose-optimization program which includes automated exposure control, adjustment of the mA and/or kV according to patient size and/or use of iterative reconstruction technique. COMPARISON:  None Available. FINDINGS: CT HEAD FINDINGS Brain: Scattered hypodense subcortical and periventricular white matter lesions nonspecific but likely reflecting sequela of chronic microvascular ischemic white matter disease. Lacunar type infarcts in the right basal ganglia. No evidence of acute large vascular territory infarction, hemorrhage, hydrocephalus, extra-axial collection or mass lesion/mass effect. Vascular: No hyperdense vessel. Atherosclerotic calcifications of the internal carotid and vertebral arteries at the skull base. Skull: Calvarium is intact. Other: Mastoid air cells are predominantly clear. CT MAXILLOFACIAL FINDINGS Osseous: Impacted leftward deviated fracture of the nasal bones with overlying soft tissue swelling and gas. Deformity of the nasal septum with cortical step-off anteriorly for instance on image 6/4 and a probable septal hematoma. No mandibular dislocation. No destructive process. Orbits: Orbits are grossly unremarkable. Sinuses: Visualized portions of the paranasal sinuses are predominantly clear. Soft tissues: Soft tissue swelling with subcutaneous gas overlying the nares. Soft tissue swelling overlying the right frontal bone. CT CERVICAL SPINE FINDINGS Alignment: Straightening of the normal cervical lordosis. No evidence of traumatic listhesis. Skull base and vertebrae: No acute fracture. No primary bone lesion or focal pathologic process. Soft tissues and spinal canal: Partially mineralized pannus posterior to the dens. No prevertebral fluid or swelling. No visible canal  hematoma. Disc levels: Advanced multilevel degenerative changes spine with disc space narrowing, osteophyte formation, and uncovertebral/facet hypertrophy which produce multilevel neural foraminal impingement and spinal canal narrowing. Upper chest: Negative. Other: Carotid artery calcifications. IMPRESSION: 1. Impacted leftward deviated multipart fracture of the nasal bones with overlying soft tissue swelling and gas. 2. Deformity of the nasal septum with cortical step-off anteriorly and a probable septal hematoma. 3. Soft tissue swelling overlying the right frontal bone. 4. No acute intracranial abnormality. 5. No evidence of traumatic listhesis or acute fracture of the cervical spine. 6. Advanced multilevel degenerative changes spine. Electronically Signed   By: Maudry Mayhew M.D.   On: 07/26/2022 12:49   CT Cervical Spine Wo Contrast  Result Date: 07/26/2022 CLINICAL DATA:  Fall, facial trauma. EXAM: CT HEAD WITHOUT CONTRAST CT MAXILLOFACIAL WITHOUT CONTRAST CT CERVICAL SPINE WITHOUT CONTRAST TECHNIQUE: Multidetector CT imaging of the head, cervical spine, and maxillofacial structures were performed using the standard protocol without intravenous contrast. Multiplanar CT image reconstructions of the cervical spine and maxillofacial structures were also generated. RADIATION DOSE REDUCTION: This exam was performed according to the departmental  dose-optimization program which includes automated exposure control, adjustment of the mA and/or kV according to patient size and/or use of iterative reconstruction technique. COMPARISON:  None Available. FINDINGS: CT HEAD FINDINGS Brain: Scattered hypodense subcortical and periventricular white matter lesions nonspecific but likely reflecting sequela of chronic microvascular ischemic white matter disease. Lacunar type infarcts in the right basal ganglia. No evidence of acute large vascular territory infarction, hemorrhage, hydrocephalus, extra-axial collection or mass  lesion/mass effect. Vascular: No hyperdense vessel. Atherosclerotic calcifications of the internal carotid and vertebral arteries at the skull base. Skull: Calvarium is intact. Other: Mastoid air cells are predominantly clear. CT MAXILLOFACIAL FINDINGS Osseous: Impacted leftward deviated fracture of the nasal bones with overlying soft tissue swelling and gas. Deformity of the nasal septum with cortical step-off anteriorly for instance on image 6/4 and a probable septal hematoma. No mandibular dislocation. No destructive process. Orbits: Orbits are grossly unremarkable. Sinuses: Visualized portions of the paranasal sinuses are predominantly clear. Soft tissues: Soft tissue swelling with subcutaneous gas overlying the nares. Soft tissue swelling overlying the right frontal bone. CT CERVICAL SPINE FINDINGS Alignment: Straightening of the normal cervical lordosis. No evidence of traumatic listhesis. Skull base and vertebrae: No acute fracture. No primary bone lesion or focal pathologic process. Soft tissues and spinal canal: Partially mineralized pannus posterior to the dens. No prevertebral fluid or swelling. No visible canal hematoma. Disc levels: Advanced multilevel degenerative changes spine with disc space narrowing, osteophyte formation, and uncovertebral/facet hypertrophy which produce multilevel neural foraminal impingement and spinal canal narrowing. Upper chest: Negative. Other: Carotid artery calcifications. IMPRESSION: 1. Impacted leftward deviated multipart fracture of the nasal bones with overlying soft tissue swelling and gas. 2. Deformity of the nasal septum with cortical step-off anteriorly and a probable septal hematoma. 3. Soft tissue swelling overlying the right frontal bone. 4. No acute intracranial abnormality. 5. No evidence of traumatic listhesis or acute fracture of the cervical spine. 6. Advanced multilevel degenerative changes spine. Electronically Signed   By: Maudry Mayhew M.D.   On:  07/26/2022 12:49   DG Knee Complete 4 Views Right  Result Date: 07/26/2022 CLINICAL DATA:  Anterior knee pain after a fall. EXAM: RIGHT KNEE - COMPLETE 4+ VIEW COMPARISON:  Right knee x-rays dated February 24, 2017. FINDINGS: No acute fracture or dislocation. Chronic small suprapatellar joint effusion. Progressive moderate medial and lateral compartment joint space narrowing. Progressive tricompartmental marginal osteophytosis. Soft tissues are unremarkable. IMPRESSION: 1. No acute osseous abnormality. 2. Progressive moderate osteoarthritis. Electronically Signed   By: Obie Dredge M.D.   On: 07/26/2022 12:24    Procedures Procedures    Medications Ordered in ED Medications  amoxicillin-clavulanate (AUGMENTIN) 875-125 MG per tablet 1 tablet (has no administration in time range)  Tdap (BOOSTRIX) injection 0.5 mL (has no administration in time range)  amLODipine (NORVASC) tablet 5 mg (has no administration in time range)  oxyCODONE-acetaminophen (PERCOCET/ROXICET) 5-325 MG per tablet 1 tablet (1 tablet Oral Given 07/26/22 1150)    ED Course/ Medical Decision Making/ A&P                           Medical Decision Making TERRESSA EVOLA is a 88 y.o. female here presenting with fall.  Patient had a mechanical fall and hit her face.  Patient has abrasion of the nose.  Patient is not on blood thinners.  Patient has nonfocal neuro exam.  Plan to get CT head neck and face and basic labs.  Patient is  hypertensive with blood pressure around 200 but she is on 3 BP meds.  We will get CT head to rule out a bleed.  3:47 PM I reviewed patient's labs and independently interpreted CT scans.  CT head showed no bleed.  CT of the maxillofacial showed impacted leftward deviation of the nasal bone fractures.  Patient has dried blood in the nostril and no obvious septal hematoma.  Patient's pain is under control with pain medicine.  Patient's blood pressure improved to 180 from 200.  At this point, I told patient that  she can follow-up with ENT in 1 to 2 weeks after the swelling improves.  We will prescribe some pain medicine.  I also updated her tetanus.  We will also give some Augmentin to prevent infection  Problems Addressed: Closed fracture of nasal bone, initial encounter: acute illness or injury Hypertension, unspecified type: acute illness or injury  Amount and/or Complexity of Data Reviewed Labs: ordered. Decision-making details documented in ED Course. Radiology: ordered and independent interpretation performed. Decision-making details documented in ED Course. ECG/medicine tests: ordered and independent interpretation performed. Decision-making details documented in ED Course.  Risk Prescription drug management.    Final Clinical Impression(s) / ED Diagnoses Final diagnoses:  None    Rx / DC Orders ED Discharge Orders     None         Charlynne PanderYao, Maxi Carreras Hsienta, MD 07/26/22 1549

## 2022-07-26 NOTE — ED Provider Triage Note (Addendum)
Emergency Medicine Provider Triage Evaluation Note  Maureen [name] , a 88 y.o. female  was evaluated in triage.  Pt complains of fall.  Patient states that she was walking with her walker when her "right knee gave out" causing her to fall.  She states she had symptoms like this prior of her right knee giving out on her but has not fallen.  She states that she hit face first on the cement.  She denies any presyncopal symptoms.  She is complaining of facial pain.  She denies any loss of consciousness, blood thinner use, vomiting, blurry vision, other musculoskeletal pain.  Review of Systems  Positive: See above Negative:   Physical Exam  BP (!) 201/75 (BP Location: Right Arm)   Pulse 67   Temp 97.8 F (36.6 C) (Oral)   Resp 18   SpO2 97%  Gen:   Awake, no distress   Resp:  Normal effort  MSK:   Moves extremities without difficulty  Other:  Obvious abrasion noted on patient's nasal bridge as well as nose.  Possible septal hematoma.  Patient has no tenderness to palpation of cervical, thoracic, lumbar spine.  Cranial nerves III through XII grossly intact.  No tenderness to palpation of anterior posterior chest wall or along upper or lower extremities. Patient moves all 4 extremities without difficulty.  No active bleeding noted on exam.  Medical Decision Making  Medically screening exam initiated at 11:43 AM.  Appropriate orders placed.  Maureen [name] was informed that the remainder of the evaluation will be completed by another provider, this initial triage assessment does not replace that evaluation, and the importance of remaining in the ED until their evaluation is complete.    Peter Garter, [place] 07/26/22 1146    Peter Garter, [place] 07/26/22 1201

## 2022-07-26 NOTE — ED Notes (Signed)
Patient verbalizes understanding of discharge instructions. Opportunity for questioning and answers were provided. Armband removed by staff, pt discharged from ED. Pt taken to ED entrance via wheelchair with family.

## 2022-07-26 NOTE — ED Triage Notes (Signed)
Pt arrived POV c/o a fall that happened this morning. Pt fell on her back porch face first. Pt denies any LOC. Pt is c/o facial pain and nose pain. Pt is missing the top layer of skin on her nose and an abrasion in the middle of the forehead.

## 2022-08-22 ENCOUNTER — Other Ambulatory Visit: Payer: Self-pay | Admitting: Occupational Therapy

## 2022-08-22 NOTE — Patient Instructions (Signed)
1.She would like to feel more safe and independent in her bathrooms. (grab bars will help  as well as a comfort height toilet). Client got the grab bars and passed on getting the comfort height toilet. She has been using the grab bar without issues per her report.   2.She would like to feel safe getting into and out of her entrances at her house and out building (ramp with deck at side door (MET), ramp at out building([facility] unable to do)and rail at front entrance (MET)are recommended).  Client has been using new ramp and is completely delighted with it; especially since the day before she fell up the steps that are now covered by her new porch/ramp.

## 2022-08-22 NOTE — Patient Outreach (Signed)
Aging Gracefully Program  OT FINAL Visit  08/22/2022  [name] 03/23/1933 [id number]  Visit:  4- Fourth Visit  Start Time:  1640 End Time:  1720 Total Minutes:  40   Readiness to Change:  Readiness to Change Score: 10  Patient Education: Education Provided: Yes Education Details: Tips for Aging at home booklet Person(s) Educated: Patient Comprehension: Verbalized Understanding  Goals:  Goals Addressed             This Visit's Progress    COMPLETED: Patient Stated       She would like to feel more safe and independent in her bathrooms. (grab bars will help  as well as a comfort height toilet). Client got the grab bars and passed on getting the comfort height toilet. She has been using the grab bar without issues per her report.     COMPLETED: Patient Stated       She would like to feel safe getting into and out of her entrances at her house and out building (ramp with deck at side door (MET), ramp at out building([facility] unable to do)and rail at front entrance (MET)are recommended).  Client has been using new ramp and is completely delighted with it; especially since the day before she fell up the steps that are now covered by her new porch/ramp.        Post Clinical Reasoning: Client Action (Goal) One Interventions: Pt now as grab at her shower to make it safer for her to step into and out of her shower--client reports it is working well for her Targeted Problem Area Status: A Lot Better Client Action (Goal) Two Interventions: Client now has a deck/ramp at her kitchen entrance, a rail at her front entrance as well as grab bar on her porch for stepping up into her house. Targeted Problem Area Status: A Lot Better Clinician View Of Client Situation:: Maureen [name] was anxious when I arrived stating she felt her BP was up, that she had not taken her BP meds last night or this morning since her newest one makes her feel sick to her stomach.She had taken her BP and it was  high so she took her BP meds ~1.5 hours before I arrived. I took it and again it was high, waited 30 minutes took it again and it was coming down but still in the higher range, Maureen [name] was starting to feel better. I spoke to her dauthter on the phone and they have a work in appointment for Maureen [name] tomorrow morning at her primary care provider's office. Client View Of His/Her Situation:: Maureen [name] is really happy with her new deck/ramp, her direct quote was "I am crazy in love with my ramp". She also very appreciative of all the other work [facility] did for her. She is concerned about her BP but is not one to want to take medications (per her dtr) and does not always take them as prescribed. I can understand why she would not want to take her current new BP med (since it makes her feel sick). This will be addressed at her primary care's visit. Next Visit Plan:: This was the last visit.  [place], OTR/L [facility] Aging Gracefully [phone] Office [phone]

## 2022-08-23 ENCOUNTER — Encounter (HOSPITAL_COMMUNITY): Payer: Self-pay

## 2022-08-23 ENCOUNTER — Other Ambulatory Visit: Payer: Self-pay

## 2022-08-23 ENCOUNTER — Emergency Department (HOSPITAL_COMMUNITY)
Admission: EM | Admit: 2022-08-23 | Discharge: 2022-08-23 | Discharge status: Against Medical Advice | Payer: Medicare Other | Attending: Emergency Medicine | Admitting: Emergency Medicine

## 2022-08-23 DIAGNOSIS — Z5321 Procedure and treatment not carried out due to patient leaving prior to being seen by health care provider: Secondary | ICD-10-CM | POA: Insufficient documentation

## 2022-08-23 DIAGNOSIS — R030 Elevated blood-pressure reading, without diagnosis of hypertension: Secondary | ICD-10-CM | POA: Insufficient documentation

## 2022-08-23 LAB — BASIC METABOLIC PANEL
Anion gap: 12 (ref 5–15)
BUN: 19 mg/dL (ref 8–23)
CO2: 25 mmol/L (ref 22–32)
Calcium: 10 mg/dL (ref 8.9–10.3)
Chloride: 97 mmol/L — ABNORMAL LOW (ref 98–111)
Creatinine, Ser: 0.93 mg/dL (ref 0.44–1.00)
GFR, Estimated: 59 mL/min — ABNORMAL LOW (ref >60)
Glucose, Bld: 120 mg/dL — ABNORMAL HIGH (ref 70–99)
Potassium: 3.5 mmol/L (ref 3.5–5.1)
Sodium: 134 mmol/L — ABNORMAL LOW (ref 135–145)

## 2022-08-23 LAB — CBC
HCT: 45.6 % (ref 36.0–46.0)
Hemoglobin: 14.9 g/dL (ref 12.0–15.0)
MCH: 26.1 pg (ref 26.0–34.0)
MCHC: 32.7 g/dL (ref 30.0–36.0)
MCV: 79.9 fL — ABNORMAL LOW (ref 80.0–100.0)
Platelets: 229 10*3/uL (ref 150–400)
RBC: 5.71 MIL/uL — ABNORMAL HIGH (ref 3.87–5.11)
RDW: 15.7 % — ABNORMAL HIGH (ref 11.5–15.5)
WBC: 9.5 10*3/uL (ref 4.0–10.5)
nRBC: 0 % (ref 0.0–0.2)

## 2022-08-23 LAB — TROPONIN I (HIGH SENSITIVITY): Troponin I (High Sensitivity): 16 ng/L (ref <18)

## 2022-08-23 NOTE — ED Triage Notes (Addendum)
Patient states today she noticed that her blood pressure was high. At home this morning it was 177/92. 2215: 163/93, and at midnight is was 199/84.  Patient states her primary care stopped her blood pressure medications 2 weeks ago due to ringing in the ear. Patent took amlodipine and HCTZ today.  Patient denies dizziness, headache, chest pain,or blurred vision.

## 2022-08-23 NOTE — ED Notes (Signed)
Pt left the facility 

## 2022-11-23 ENCOUNTER — Other Ambulatory Visit: Payer: Self-pay

## 2022-11-23 NOTE — Patient Outreach (Signed)
Aging Gracefully Program  11/23/2022  Maureen [name] 06-23-1933 [id number]   [facility] Evaluation Interviewer attempted to call patient on today regarding Aging Gracefully referral. No answer from patient after multiple rings. CMA left confidential voicemail for patient to return call.  Will attempt to call back within 1 week.   Vanice Sarah Care Management Assistant [phone]

## 2023-01-06 ENCOUNTER — Other Ambulatory Visit: Payer: Self-pay

## 2023-01-06 NOTE — Patient Outreach (Signed)
Aging Gracefully Program  01/06/2023  Maureen [name] October 05, 1933 [id number]   [facility] Evaluation Interviewer made contact with patient. Aging Gracefully 5 month survey completed.    [place] Management Assistant [phone]

## 2023-06-06 ENCOUNTER — Other Ambulatory Visit: Payer: Self-pay

## 2023-06-06 NOTE — Patient Outreach (Signed)
Aging Gracefully Program  06/06/2023  Maureen [name] 1933/10/09 [id number]   [facility] Evaluation Interviewer made contact with patient. Aging Gracefully 9 month survey completed.    Vanice Sarah Care Management Assistant [phone]

## 2023-07-30 ENCOUNTER — Encounter: Payer: Self-pay | Admitting: Family

## 2023-08-16 ENCOUNTER — Ambulatory Visit (INDEPENDENT_AMBULATORY_CARE_PROVIDER_SITE_OTHER): Payer: Medicare Other | Admitting: Podiatry

## 2023-08-16 ENCOUNTER — Encounter: Payer: Self-pay | Admitting: Podiatry

## 2023-08-16 DIAGNOSIS — Z91199 Patient's noncompliance with other medical treatment and regimen due to unspecified reason: Secondary | ICD-10-CM

## 2023-09-25 NOTE — Progress Notes (Signed)
Not sure

## 2023-11-01 ENCOUNTER — Ambulatory Visit (INDEPENDENT_AMBULATORY_CARE_PROVIDER_SITE_OTHER): Payer: TRICARE For Life (TFL)

## 2025-01-30 ENCOUNTER — Encounter: Payer: Self-pay | Admitting: Nurse Practitioner

## 2025-02-25 ENCOUNTER — Ambulatory Visit: Admitting: Nurse Practitioner
